# Patient Record
Sex: Male | Born: 1950
Health system: Southern US, Community
[De-identification: ages and names within clinical notes are randomized; demographics above are authoritative.]

## PROBLEM LIST (undated history)

## (undated) DIAGNOSIS — K219 Gastro-esophageal reflux disease without esophagitis: Secondary | ICD-10-CM

## (undated) DIAGNOSIS — Z973 Presence of spectacles and contact lenses: Secondary | ICD-10-CM

## (undated) DIAGNOSIS — I77810 Thoracic aortic ectasia: Secondary | ICD-10-CM

## (undated) DIAGNOSIS — K589 Irritable bowel syndrome without diarrhea: Secondary | ICD-10-CM

## (undated) DIAGNOSIS — K6389 Other specified diseases of intestine: Secondary | ICD-10-CM

## (undated) DIAGNOSIS — M1990 Unspecified osteoarthritis, unspecified site: Secondary | ICD-10-CM

## (undated) HISTORY — PX: KNEE ARTHROSCOPY: SUR90

## (undated) HISTORY — PX: HERNIA REPAIR: SHX51

## (undated) HISTORY — DX: Thoracic aortic ectasia: I77.810

## (undated) HISTORY — DX: Irritable bowel syndrome, unspecified: K58.9

## (undated) HISTORY — DX: Presence of spectacles and contact lenses: Z97.3

## (undated) HISTORY — PX: MASS EXCISION: SHX2000

## (undated) HISTORY — DX: Unspecified osteoarthritis, unspecified site: M19.90

## (undated) HISTORY — DX: Other specified diseases of intestine: K63.89

## (undated) HISTORY — DX: Gastro-esophageal reflux disease without esophagitis: K21.9

---

## 1994-01-22 DIAGNOSIS — M170 Bilateral primary osteoarthritis of knee: Secondary | ICD-10-CM | POA: Insufficient documentation

## 1998-01-11 ENCOUNTER — Ambulatory Visit (HOSPITAL_BASED_OUTPATIENT_CLINIC_OR_DEPARTMENT_OTHER): Admission: RE | Admit: 1998-01-11 | Discharge: 1998-01-11 | Payer: Self-pay | Admitting: General Surgery

## 1998-01-22 DIAGNOSIS — M47819 Spondylosis without myelopathy or radiculopathy, site unspecified: Secondary | ICD-10-CM | POA: Insufficient documentation

## 1998-01-28 ENCOUNTER — Encounter: Payer: Self-pay | Admitting: Specialist

## 1998-01-28 ENCOUNTER — Ambulatory Visit (HOSPITAL_COMMUNITY): Admission: RE | Admit: 1998-01-28 | Discharge: 1998-01-28 | Payer: Self-pay | Admitting: Specialist

## 1998-02-07 ENCOUNTER — Encounter: Payer: Self-pay | Admitting: Specialist

## 1998-02-07 ENCOUNTER — Ambulatory Visit (HOSPITAL_COMMUNITY): Admission: RE | Admit: 1998-02-07 | Discharge: 1998-02-07 | Payer: Self-pay | Admitting: Specialist

## 2001-03-11 ENCOUNTER — Ambulatory Visit (HOSPITAL_COMMUNITY): Admission: RE | Admit: 2001-03-11 | Discharge: 2001-03-11 | Payer: Self-pay | Admitting: Gastroenterology

## 2001-03-21 ENCOUNTER — Ambulatory Visit (HOSPITAL_COMMUNITY): Admission: RE | Admit: 2001-03-21 | Discharge: 2001-03-21 | Payer: Self-pay | Admitting: Internal Medicine

## 2001-03-21 ENCOUNTER — Encounter: Payer: Self-pay | Admitting: Internal Medicine

## 2001-07-17 ENCOUNTER — Encounter: Admission: RE | Admit: 2001-07-17 | Discharge: 2001-07-17 | Payer: Self-pay | Admitting: Internal Medicine

## 2001-07-17 ENCOUNTER — Encounter: Payer: Self-pay | Admitting: Internal Medicine

## 2002-07-14 ENCOUNTER — Encounter: Payer: Self-pay | Admitting: Internal Medicine

## 2002-07-14 ENCOUNTER — Encounter: Admission: RE | Admit: 2002-07-14 | Discharge: 2002-07-14 | Payer: Self-pay | Admitting: Internal Medicine

## 2003-01-23 DIAGNOSIS — M19011 Primary osteoarthritis, right shoulder: Secondary | ICD-10-CM | POA: Insufficient documentation

## 2004-09-05 ENCOUNTER — Observation Stay (HOSPITAL_COMMUNITY): Admission: AD | Admit: 2004-09-05 | Discharge: 2004-09-08 | Payer: Self-pay | Admitting: Internal Medicine

## 2006-08-06 ENCOUNTER — Encounter: Admission: RE | Admit: 2006-08-06 | Discharge: 2006-08-06 | Payer: Self-pay | Admitting: Internal Medicine

## 2006-08-09 ENCOUNTER — Encounter: Admission: RE | Admit: 2006-08-09 | Discharge: 2006-08-09 | Payer: Self-pay | Admitting: Internal Medicine

## 2006-08-26 ENCOUNTER — Encounter (HOSPITAL_COMMUNITY): Admission: RE | Admit: 2006-08-26 | Discharge: 2006-10-16 | Payer: Self-pay | Admitting: Internal Medicine

## 2006-12-03 ENCOUNTER — Encounter: Admission: RE | Admit: 2006-12-03 | Discharge: 2006-12-03 | Payer: Self-pay | Admitting: Internal Medicine

## 2007-06-02 ENCOUNTER — Encounter: Admission: RE | Admit: 2007-06-02 | Discharge: 2007-06-02 | Payer: Self-pay | Admitting: Internal Medicine

## 2008-01-29 ENCOUNTER — Encounter: Admission: RE | Admit: 2008-01-29 | Discharge: 2008-01-29 | Payer: Self-pay | Admitting: Internal Medicine

## 2008-02-23 ENCOUNTER — Ambulatory Visit (HOSPITAL_COMMUNITY): Admission: RE | Admit: 2008-02-23 | Discharge: 2008-02-23 | Payer: Self-pay | Admitting: General Surgery

## 2008-02-23 HISTORY — PX: TUMOR REMOVAL: SHX12

## 2008-03-12 ENCOUNTER — Encounter (INDEPENDENT_AMBULATORY_CARE_PROVIDER_SITE_OTHER): Payer: Self-pay | Admitting: General Surgery

## 2008-03-12 ENCOUNTER — Inpatient Hospital Stay (HOSPITAL_COMMUNITY): Admission: AD | Admit: 2008-03-12 | Discharge: 2008-03-18 | Payer: Self-pay | Admitting: General Surgery

## 2009-07-06 ENCOUNTER — Encounter: Admission: RE | Admit: 2009-07-06 | Discharge: 2009-07-06 | Payer: Self-pay | Admitting: General Surgery

## 2010-02-12 ENCOUNTER — Encounter: Payer: Self-pay | Admitting: General Surgery

## 2010-05-09 LAB — BASIC METABOLIC PANEL
CO2: 31 mEq/L (ref 19–32)
Calcium: 8.7 mg/dL (ref 8.4–10.5)
Chloride: 101 mEq/L (ref 96–112)
GFR calc Af Amer: >60 mL/min (ref >60)
GFR calc non Af Amer: >60 mL/min (ref >60)
Glucose, Bld: 106 mg/dL — ABNORMAL HIGH (ref 70–99)
Potassium: 4.4 mEq/L (ref 3.5–5.1)
Potassium: 4.7 mEq/L (ref 3.5–5.1)
Sodium: 134 mEq/L — ABNORMAL LOW (ref 135–145)
Sodium: 138 mEq/L (ref 135–145)

## 2010-05-09 LAB — CBC
HCT: 32.9 % — ABNORMAL LOW (ref 39.0–52.0)
Hemoglobin: 11.3 g/dL — ABNORMAL LOW (ref 13.0–17.0)
Hemoglobin: 12.8 g/dL — ABNORMAL LOW (ref 13.0–17.0)
MCHC: 34.3 g/dL (ref 30.0–36.0)
MCV: 86.7 fL (ref 78.0–100.0)
Platelets: 183 10*3/uL (ref 150–400)
RBC: 4.33 MIL/uL (ref 4.22–5.81)
RDW: 13.6 % (ref 11.5–15.5)
RDW: 13.8 % (ref 11.5–15.5)
WBC: 4.8 10*3/uL (ref 4.0–10.5)

## 2010-05-09 LAB — DIFFERENTIAL
Basophils Relative: 0 % (ref 0–1)
Eosinophils Relative: 3 % (ref 0–5)
Monocytes Absolute: 0.4 10*3/uL (ref 0.1–1.0)
Monocytes Relative: 9 % (ref 3–12)
Neutro Abs: 2.9 10*3/uL (ref 1.7–7.7)

## 2010-05-09 LAB — COMPREHENSIVE METABOLIC PANEL
ALT: 28 U/L (ref 0–53)
AST: 21 U/L (ref 0–37)
Albumin: 3.9 g/dL (ref 3.5–5.2)
Alkaline Phosphatase: 89 U/L (ref 39–117)
BUN: 16 mg/dL (ref 6–23)
Chloride: 103 mEq/L (ref 96–112)
GFR calc Af Amer: >60 mL/min (ref >60)
Potassium: 4.3 mEq/L (ref 3.5–5.1)
Sodium: 142 mEq/L (ref 135–145)
Total Bilirubin: 0.7 mg/dL (ref 0.3–1.2)
Total Protein: 6.7 g/dL (ref 6.0–8.3)

## 2010-05-09 LAB — GLUCOSE, CAPILLARY: Glucose-Capillary: 91 mg/dL (ref 70–99)

## 2010-05-09 LAB — TYPE AND SCREEN: DAT, IgG: POSITIVE

## 2010-06-06 NOTE — Op Note (Signed)
Terry Roy, Terry Roy              ACCOUNT NO.:  [id number]   MEDICAL RECORD NO.:  [id number]          PATIENT TYPE:  INP   LOCATION:  0001                         FACILITY:  [facility]   PHYSICIAN:  Angelia Mould. Derrell Lolling, M.D.DATE OF BIRTH:  [birth year]   DATE OF PROCEDURE:  03/12/2008  DATE OF DISCHARGE:                               OPERATIVE REPORT   PREOPERATIVE DIAGNOSIS:  Small bowel mesenteric mass, suspect carcinoid  tumor.   POSTOPERATIVE DIAGNOSIS:  Small bowel mesenteric mass, suspect carcinoid  tumor.   OPERATION PERFORMED:  Segmental small bowel resection, resection of  mesenteric mass.   SURGEON:  Angelia Mould. Derrell Lolling, M.D.   FIRST ASSISTANT:  Sharlet Salina T. Hoxworth, M.D.   OPERATIVE INDICATIONS:  This is a 60 year old white man who has a 20-  year history of intermittent nausea and abdominal pain, although this  has not been disabling in any way.  This would occur every few months.  For the past 6 months, things have been getting worse with increasing  severity of pain and occasional vomiting, although he has not had any  weight loss.  In August 2008 he was hospitalized with abdominal pain and  had a CT scan and OctreoScan which showed a tissue density in the  mesentery.  The OctreoScan was negative, thus this was thought to be  benign.  He was followed with no change in his condition.  He had  worsening symptoms recently and a CT scan showed a calcified mass in the  small bowel mesentery measuring 3.2 cm x 2.5 cm, which was enlarged  compared to 1.6 x 1.3 cm in July 2008.  There were also some tiny  nodules.  Radiographically, they thought this was suspicious for  carcinoid tumor.  Colonoscopy was unremarkable.  Ultrasound of his  gallbladder was unremarkable.  I saw him as an outpatient.  He was  counseled to have an abdominal exploration and resection of this area.  Differential diagnosis was discussed.  He is in agreement and was  brought to the operating room  electively.   FINDINGS:  The patient had a hard multilobulated malignant mass  involving the proximal small bowel mesentery.  This was very bulky in  the mid jejunum.  Palpable tumor extended all the way back to and  probably surrounding the superior mesenteric artery, but the more bulky  part of the tumor was out in the jejunal branches.  I examined the small  bowel from the ligament of Treitz all the way to the ileocecal valve and  found no tumors or palpable abnormalities.  The appendix and cecum and  colon felt normal.  The rectum felt normal.  The retroperitoneum felt  normal.  The liver and gallbladder felt normal.  The stomach felt  normal.  There was no ascites.   OPERATIVE TECHNIQUE:  Following the induction of general endotracheal  anesthesia, a Foley catheter was inserted, and the patient's abdomen was  prepped and draped in a sterile fashion.  Time-out was held and the  patient was identified as correct patient and correct procedure.  Imaging was reviewed.  Antibiotics were given.  Midline laparotomy  incision was made.  The fascia was incised in the midline and the  abdominal cavity entered and explored, with findings as described above.  A self-retaining retractor was placed and we packed off the stomach and  transverse colon.  We examined everything very carefully and decided  about strategy for resection.  We decided to resect a segment of small  bowel probably 2 feet in length.  We transected the small bowel which  was mostly mid jejunum.  We transected proximally and distally using a  GIA stapling device.  The more peripheral mesentery was controlled with  a LigaSure device.  As we got down around the malignant-appearing  mesenteric mass, we took the dissection very cautiously and slowly.  The  mesentery was thicker in this area and we isolated small segments of the  mesentery between clamps, divided it, and controlled it with 2-0 silk  suture ligatures.  We got down  to the very proximal part of the  mesentery and very carefully dissected around this area, keeping in mind  the location of the superior mesenteric artery which was probably no  more than 1-2 cm away.  As we came across the mesentery, there was one  major jejunal branch which bled fairly briskly, but we were able to  clamp this quickly with 3 clamps, divide it, and remove the specimen.   The specimen was sent to the lab and Dr. Delila Spence looked at this and cut  it and said it had a typical yellow color of a carcinoid tumor.  He said  he did not see a primary tumor in the small bowel.   We controlled the jejunal mesenteric arterial supply with 2 suture  ligatures of 2-0 silk.  This provided nice secure control.  We irrigated  out the entire area.  The distal small bowel looked a little bit  ischemic for about 2 inches, and so we chose to resect about another 2  inches of small bowel, dividing the mesentery with the LigaSure, and  dividing the small bowel with the GIA.  This was sent for routine  histology.   At this point, we had excellent pulsations in the mesentery and pink,  healthy bowel.  Anastomosis between the proximal and distal limbs of the  small bowel were created with the GIA stapling device.  We checked the  staple line and there was no bleeding.  The defect in the bowel wall was  closed with a TA 60 stapling device.  A few sutures of 3-0 silk were  placed to reinforce the staple line at critical points.   At this point we changed our gloves and instruments.  We irrigated out  the abdomen and pelvis fairly extensively with saline.  We checked for  bleeding in all of the areas of dissection and it looked very  hemostatic.  The mesentery was closed with multiple interrupted figure-  of-eight sutures of 3-0 silk.  After checking one more time, we placed  the small bowel and omentum back to their anatomic positions.  The  midline fascia was closed with a running suture of #1,  double-stranded  PDS, and skin was closed with skin staples.  Clean bandages were placed  and the patient taken to the recovery room in stable condition.  Estimated blood loss was about 200 mL or less.  Complications none.  Sponge, needle and instrument counts were correct.      Angelia Mould. Derrell Lolling, M.D.  Electronically Signed  HMI/MEDQ  D:  03/12/2008  T:  03/12/2008  Job:  [id number]   cc:   Thora Lance, M.D.  Fax: [phone]   Tasia Catchings, M.D.  Fax: [phone]   Lauretta I. Odogwu, M.D.  Fax: [phone]

## 2010-06-09 NOTE — Discharge Summary (Signed)
Terry Roy, Terry Roy NO.:  [id number]   MEDICAL RECORD NO.:  [id number]          PATIENT TYPE:  INP   LOCATION:  6714                         FACILITY:  [facility]   PHYSICIAN:  Thora Lance, M.D.  DATE OF BIRTH:  29-May-1950   DATE OF ADMISSION:  09/05/2004  DATE OF DISCHARGE:  09/08/2004                                 DISCHARGE SUMMARY   REASON FOR ADMISSION:  Mr. Riches is a 60 year old white male with a history  of recurrent episodes of nausea and vomiting over the last 17 years. Over  the last 4 weeks prior to admission, he had a problem with nausea and  vomiting about 2 or 3 times a day, almost every day. Twelve days prior to  admission, he had been bringing up small amounts of blood with his vomit. He  denied hematochezia or melena. He tried Zofran, Phenergan, and Emetrol as an  outpatient without help. He takes Prevacid chronically.   PHYSICAL EXAMINATION:  VITAL SIGNS:  Normal.  Physical examination was unremarkable.   LABORATORY DATA:  On admission CBC:  White blood cell count 6.7. Hemoglobin  14.4. Platelet count 215,000. PT 13 and PTT 34. Chemistry:  Sodium 138,  potassium 4.1, chloride 103, bicarb 30, glucose 92, BUN 13, creatinine 1.3,  calcium 9.2, total protein 6.5, albumin 3.8. AST 20, ALT 27, alkaline  phosphatase 68, total bilirubin 1.1, urinalysis clear.   HOSPITAL COURSE:  The patient was admitted for his chronic nausea and  vomiting. He was started on an intravenous proton pump inhibitor,  intravenous Zofran, and Reglan intravenously. He was seen by Dr. Sherin Quarry of  the gastroenterology service. On September 07, 2004, an endoscopy was done,  which was normal. The patient's nausea and vomiting did improve over the 2  plus days that he was in the hospital. At discharge, we decided to start him  on Zelnorm for possible motility disorder. He was asked to stop all of his  herbal and dietary supplements. His PPI was continued.   DISCHARGE  DIAGNOSES:  1.  Nausea and vomiting.  2.  Hematemesis.   PROCEDURE:  Esophagogastroduodenoscopy.   DISCHARGE MEDICATIONS:  1.  Prevacid 30 mg a day.  2.  Zelnorm 60 mg b.i.d.  3.  An anti-depressant will be considered as an outpatient.   DISPOSITION:  The patient home.   DIET:  Regular.   FOLLOW UP:  In 1 to 2 weeks Dr. Valentina Lucks.           ______________________________  Thora Lance, M.D.     JJG/MEDQ  D:  09/08/2004  T:  09/08/2004  Job:  [id number]   cc:   Gretta Arab. Valentina Lucks, M.D.  [address] [place]  [place] [address]  Fax: [phone]

## 2010-06-09 NOTE — Discharge Summary (Signed)
NAMEODA, LANSDOWNE NO.:  [id number]   MEDICAL RECORD NO.:  [id number]          PATIENT TYPE:  INP   LOCATION:  1531                         FACILITY:  [facility]   PHYSICIAN:  Angelia Mould. Derrell Lolling, M.D.DATE OF BIRTH:  July 20, 1950   DATE OF ADMISSION:  03/12/2008  DATE OF DISCHARGE:  03/18/2008                               DISCHARGE SUMMARY   FINAL DIAGNOSES:  1. 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  2. Postoperative paralytic ileus, resolved.   OPERATIONS PERFORMED:  1. Exploratory laparotomy.  2. Small bowel resection.  3. Resection of mesenteric mass.   DATE OF SURGERY:  March 12, 2008.   HISTORY:  This is a 60 year old white man who has a 20-year history of  intermittent nausea and abdominal pain.  This occurs every few months,  and would last several days at a time.  He has had no change in bowel  movements, no flushing, sweats or  hypertensive issues.  The pain has  been in his central upper abdomen.  He has had some occasional vomiting  more recently.  This been progressive to where he is now having some  daily symptoms.  His weight has remained stable.  In August 2008 he had  some imaging studies including a CT scan and Octreoscan which showed a  tissue density in the mesentery, but the Octreoscan was negative.  This  was thought to be benign.  Because of progressive symptoms, he recently  had another CT scan which showed a calcified mass in the small bowel  mesentery measuring 3.2 cm x 2.5 cm which was larger than it was before.  There were multiple adjacent nodules.  There was vascular lymphatic  congestion.  The radiologist felt that this was highly suspicious for  carcinoid tumor.  I evaluated the patient as an outpatient.  I was  unsure as to what the cause of his pain and abdominal mass were, but  advised him to undergo exploration and resection of this area if  possible.  He was brought to the hospital  electively.   HOSPITAL COURSE:  On the day of admission, the patient was taken to the  operating room and underwent exploration.  I found that he had a hard  mass in the upper small bowel mesentery.  This could not be completely  resected as the thickening extended all the way back to the superior  mesenteric artery.  We did a subtotal debulking of this 3.5 to 4 cm  mass, and did a small bowel resection in the area with primary  anastomosis.   The final pathology report returned that this was a benign process.  A  partially calcified nodule with fibrosis and fat necrosis was noted.  All the lymph nodes were negative.  He was advised of his benign  diagnosis.   Postoperatively the patient did relatively well.  He had an ileus for  several days.  In fact had some nausea and vomiting on postoperative day  #2 which then slowly resolved.  He was tolerating a liquid diet by  the  afternoon of  February 23, and progressed to a regular diet and did well.  He was  ready to go home on February 25.  He was tolerating regular food, had  had a bowel movement, was feeling much better.  Abdominal wound looked  fine.  He was given a prescription for Tylox for pain.  He was given a  follow-up with me in the office in 1-2 weeks.      Angelia Mould. Derrell Lolling, M.D.  Electronically Signed     HMI/MEDQ  D:  04/13/2008  T:  04/13/2008  Job:  [id number]   cc:   Thora Lance, M.D.  Fax: [phone]   Theressa Millard, M.D.  Fax: [phone]   Tasia Catchings, M.D.  Fax: [phone]

## 2010-06-09 NOTE — Consult Note (Signed)
NAMEROHAN, JUENGER NO.:  [id number]   MEDICAL RECORD NO.:  [id number]          PATIENT TYPE:  INP   LOCATION:  6714                         FACILITY:  [facility]   PHYSICIAN:  Tasia Catchings, M.D.   DATE OF BIRTH:  [birth year]   DATE OF CONSULTATION:  09/06/2004  DATE OF DISCHARGE:                                   CONSULTATION   PHYSICIAN REQUESTING CONSULTATION:  Thora Lance, M.D.   HISTORY OF PRESENT ILLNESS:  Mr. Karasik is a 60 year old male who I have  known for a long time.  He has a history over 20 years of intermittent  episodes lasting usually 2-7 days of constant nausea and a.m. vomiting.  These were worked up extensively several years ago including a trip to  [facility], when nothing was really found.  Over the years he has managed  to use Emetrol, an over-the-counter antiemetic, along with many supplements  and a PPI (currently Prilosec 30 mg daily) to minimize these symptoms.   However, about 3 weeks ago he began having the same typical episode and it  has persisted.  He vomits bile and mucus; and generally only in the morning;  this may include a little food if it occurs right after he tries to eat  something; however, he is able to hold down crackers and liquids at noon,  and eat a regular meal at night.  He has, however, lost about 5 pounds over  the past 3 weeks.  In the last week, he has also noticed occasional bright  red blood of small amounts when he vomits.  He has had no melena or  hematochezia.  His bowel movements have been normal and he denies abdominal  pain, except that during the moment of retching.   His last upper endoscopy was in 2003 and was entirely normal.   PHYSICAL EXAMINATION:  GENERAL:  He is a well-developed white male who is  overweight.  ABDOMEN:  Abdominal exam reveals normal bowel sounds, no tenderness, no  hepatosplenomegaly or masses palpable and no bruits.   LABORATORY DATA:  Normal CBC and CMET.   IMPRESSION:  1.  Nausea and vomiting of uncertain etiology.  This could be a motility      disorder, even though we have not been able to prove it.  There may be a      psychiatric component of this as well.  2.  Hematemesis likely from mild irritation, doubt significant      gastrointestinal bleed.   RECOMMENDATIONS:  1.  Add Reglan IV to his Zofran and IV fluids.  2.  Hold off on endoscopy at the present time.      Tasia Catchings, M.D.  Electronically Signed    JW/MEDQ  D:  09/06/2004  T:  09/06/2004  Job:  [id number]

## 2010-10-02 ENCOUNTER — Telehealth (INDEPENDENT_AMBULATORY_CARE_PROVIDER_SITE_OTHER): Payer: Self-pay | Admitting: General Surgery

## 2010-10-02 DIAGNOSIS — K6389 Other specified diseases of intestine: Secondary | ICD-10-CM

## 2010-10-02 NOTE — Telephone Encounter (Signed)
Pt notified per last ov note by Dr Derrell Lolling, pt is to have a ct abd and pelvis for yrly f/u mesenteric mass prior to next office visit in 2012. Order is placed and to Gavin Pound to set up.

## 2010-10-06 ENCOUNTER — Other Ambulatory Visit: Payer: Self-pay

## 2010-10-10 ENCOUNTER — Ambulatory Visit
Admission: RE | Admit: 2010-10-10 | Discharge: 2010-10-10 | Disposition: A | Discharge status: Home / Self Care | Payer: Federal, State, Local not specified - PPO | Source: Ambulatory Visit | Attending: General Surgery | Admitting: General Surgery

## 2010-10-10 DIAGNOSIS — K6389 Other specified diseases of intestine: Secondary | ICD-10-CM

## 2010-10-10 MED ORDER — IOHEXOL 300 MG/ML  SOLN: 125.0000 mL | Freq: Once | INTRAMUSCULAR | Status: AC | PRN

## 2010-10-27 ENCOUNTER — Encounter (INDEPENDENT_AMBULATORY_CARE_PROVIDER_SITE_OTHER): Payer: Self-pay | Admitting: General Surgery

## 2010-10-30 ENCOUNTER — Encounter (INDEPENDENT_AMBULATORY_CARE_PROVIDER_SITE_OTHER): Payer: Self-pay | Admitting: General Surgery

## 2010-10-30 ENCOUNTER — Ambulatory Visit (INDEPENDENT_AMBULATORY_CARE_PROVIDER_SITE_OTHER): Payer: Federal, State, Local not specified - PPO | Admitting: General Surgery

## 2010-10-30 VITALS — BP 138/72 | HR 64 | Temp 97.4°F | Resp 16 | Ht 68.0 in | Wt 225.0 lb

## 2010-10-30 DIAGNOSIS — R1900 Intra-abdominal and pelvic swelling, mass and lump, unspecified site: Secondary | ICD-10-CM

## 2010-10-30 NOTE — Patient Instructions (Signed)
Your CT scan shows stability of the abdominal mass in the mesentery. There's been no change in size or in contour. There are  still some calcifications within it. Given the previous surgery this is almost certainly a benign fibrosis and nothing further needs to be done. Return to see me if further problems arise.

## 2010-10-30 NOTE — Progress Notes (Signed)
Subjective:     Patient ID: Terry Roy, male   DOB: Oct 08, 1950, 60 y.o.   MRN: [id number]  HPI This 60 year old Caucasian man returns to me for interval followup because of a mesenteric mass and tiny,  asymptomatic ventral hernias.  He is feeling well. He says that his tiny ventral hernias do not bother him. He has a chronic GI problems he continued to be followed Dr. Charlean Sanfilippo  At the Dept of gastroenterology at [facility]. He is also been seen at [facility] by Dr. Eliezer Mccoy "functional vomiting".  His weight has been stable at 225. He has occasional minimal pain in his abdomen. His appetite is excellent. His bowel function is basically normal.  Past history is significant for a laparotomy and excision of a mesenteric mass which was shown to be benign fat necrosis and fibrosis back in Feb.,  2010.  He had an interval CT scan on October 02, 2010 which shows no change. The residual calcified mesenteric mass is stable. His suggested this is a benign fibrosis or indolent tumor by the radiologist.  Past Medical History  Diagnosis Date  . IBS (irritable bowel syndrome)   . Wears glasses   . Arthritis   . Mesenteric mass     benign fat necrosis and fibrosis  . GERD (gastroesophageal reflux disease)    Current Outpatient Prescriptions  Medication Sig Dispense Refill  . fish oil-omega-3 fatty acids 1000 MG capsule Take 2 g by mouth daily.        . Glucosamine-Chondroitin (GLUCOSAMINE CHONDR COMPLEX PO) Take 2,400-3,000 mg by mouth 2 (two) times daily.        . pantoprazole (PROTONIX) 40 MG tablet daily.      . sucralfate (CARAFATE) 1 G tablet daily.       No Known Allergies   Review of Systems 12 system review of systems is performed and is negative except as described above.    Objective:   Physical Exam  Constitutional: He appears well-developed and well-nourished. No distress.  HENT:  Head: Normocephalic and atraumatic.  Eyes: Conjunctivae are normal. Pupils are equal, round,  and reactive to light. Right eye exhibits no discharge. Left eye exhibits no discharge.  Abdominal: Soft. Bowel sounds are normal. He exhibits no distension and no mass. There is no tenderness. There is no rebound and no guarding.    Musculoskeletal: Normal range of motion. He exhibits no edema.  Skin: Skin is warm and dry. No rash noted. He is not diaphoretic. No erythema. No pallor.  Psychiatric: He has a normal mood and affect. His behavior is normal. Thought content normal.       Assessment:     Mesenteric mass, benign fat necrosis and fibrosis, status post explatory laparotomy and subtotal resection. Stable by CT.  A symptomatic ventral incisional hernias, small, reducible.    Plan:     Nothing further needs to be done about his mesenteric fat necrosis. I suggested that we stop doing CTs and as he develops new symptoms. He agrees with that.  He is not interested in ventral hernia repair at this time. I told him to return to see me if they become larger or painful.  Continue followup with Dr. Shawnie Pons at [facility] regarding his chronic GI problems.  Return to see me p.r.n.

## 2011-02-23 ENCOUNTER — Other Ambulatory Visit: Payer: Self-pay | Admitting: Internal Medicine

## 2011-02-23 ENCOUNTER — Ambulatory Visit
Admission: RE | Admit: 2011-02-23 | Discharge: 2011-02-23 | Disposition: A | Discharge status: Home / Self Care | Payer: Federal, State, Local not specified - PPO | Source: Ambulatory Visit | Attending: Internal Medicine | Admitting: Internal Medicine

## 2011-02-23 DIAGNOSIS — R1031 Right lower quadrant pain: Secondary | ICD-10-CM

## 2011-02-23 MED ORDER — IOHEXOL 300 MG/ML  SOLN
125.0000 mL | Freq: Once | INTRAMUSCULAR | Status: AC | PRN
  Administered 2011-02-23: 125 mL via INTRAVENOUS

## 2011-02-23 MED ORDER — IOHEXOL 300 MG/ML  SOLN
20.0000 mL | Freq: Once | INTRAMUSCULAR | Status: AC | PRN
  Administered 2011-02-23: 20 mL via ORAL

## 2013-06-12 ENCOUNTER — Other Ambulatory Visit: Payer: Self-pay | Admitting: Internal Medicine

## 2013-06-12 DIAGNOSIS — R109 Unspecified abdominal pain: Secondary | ICD-10-CM

## 2013-06-16 DIAGNOSIS — R109 Unspecified abdominal pain: Secondary | ICD-10-CM | POA: Insufficient documentation

## 2013-06-19 ENCOUNTER — Encounter (INDEPENDENT_AMBULATORY_CARE_PROVIDER_SITE_OTHER): Payer: Self-pay

## 2013-06-19 ENCOUNTER — Ambulatory Visit
Admission: RE | Admit: 2013-06-19 | Discharge: 2013-06-19 | Disposition: A | Discharge status: Home / Self Care | Payer: Federal, State, Local not specified - PPO | Source: Ambulatory Visit | Attending: Internal Medicine | Admitting: Internal Medicine

## 2013-06-19 DIAGNOSIS — R109 Unspecified abdominal pain: Secondary | ICD-10-CM

## 2013-06-19 MED ORDER — IOHEXOL 300 MG/ML  SOLN
125.0000 mL | Freq: Once | INTRAMUSCULAR | Status: AC | PRN
  Administered 2013-06-19: 125 mL via INTRAVENOUS

## 2013-07-10 ENCOUNTER — Other Ambulatory Visit (INDEPENDENT_AMBULATORY_CARE_PROVIDER_SITE_OTHER): Payer: Self-pay | Admitting: General Surgery

## 2013-07-10 ENCOUNTER — Encounter (INDEPENDENT_AMBULATORY_CARE_PROVIDER_SITE_OTHER): Payer: Self-pay | Admitting: General Surgery

## 2013-07-10 ENCOUNTER — Ambulatory Visit (INDEPENDENT_AMBULATORY_CARE_PROVIDER_SITE_OTHER): Payer: Federal, State, Local not specified - PPO | Admitting: General Surgery

## 2013-07-10 VITALS — BP 126/82 | HR 68 | Ht 68.0 in | Wt 236.0 lb

## 2013-07-10 DIAGNOSIS — K668 Other specified disorders of peritoneum: Secondary | ICD-10-CM

## 2013-07-10 DIAGNOSIS — K6389 Other specified diseases of intestine: Secondary | ICD-10-CM

## 2013-07-10 NOTE — Progress Notes (Signed)
Patient ID: Terry Roy, male   DOB: 1950-04-08, 63 y.o.   MRN: [id number]  Chief Complaint  Patient presents with  . abdominal mass    HPI Terry Roy is a 63 y.o. male.  He is referred back to me by Dr. Lavone Orn for evaluation of chronic nausea and vomiting, abdominal pain, and calcified mesenteric mass.  In 2010 I initially  evaluated this patient for chronic abdominal pain nausea and vomiting he had been known to have a mesenteric tissue mass since 2008.   I chose to operate on him on 03/12/2008. He had a hard multilobulated will mass involving the proximal small bowel mesentery it is very bulky and extended back to the superior mesenteric artery. I found no other abnormalities. I did a small bowel resection and so a debulking of the tumor. The final pathology report showed calcified nodule, fibrosis, inflammation and fat necrosis, no malignancy, 11 benign reactive lymph nodes, normal normal small bowel. He recovered from that surgery. It was not felt that further intervention was necessary.  This patient has had chronic nausea and vomiting without weight loss or diarrhea. This has been going on for 15 or 20 years. He is currently followed by Dr. Melvyn Neth at [facility] gastroenterology. He's been told this is a functional problem. He's also been evaluated at [facility] for this. He has a six-month history of anterior abdominal pain both upper and lower. It does not radiate to the back. It was worse but is now getting better and only occurs every 4-5 days. He does not really know what  triggers  this. He has gained about 10 pounds in the last 2 or 3 years. Appetite is otherwise good. Denies flushing, palpitations and tachycardia, diarrhea. He says he has a hiatal hernia her proton pump inhibitors helpless a great deal.   CT scan of abdomen pelvis was performed recently. Once again they noted a calcified mass at the mesenteric root. They say that this was previously 2 cm in size and is now  3.2 x 4.3 cm in size. They suggested ruling out carcinoid tumor. In 2010 his octreotide scan and chemical testing was negative for this.  He is here today with his wife. He is in no distress. HPI  Past Medical History  Diagnosis Date  . IBS (irritable bowel syndrome)   . Wears glasses   . Arthritis   . Mesenteric mass     benign fat necrosis and fibrosis  . GERD (gastroesophageal reflux disease)     Past Surgical History  Procedure Laterality Date  . Tumor removal  February 2010    benign   . Mass excision      mesenteric mass resection   . Hernia repair      ventral hernia     Family History  Problem Relation Age of Onset  . Osteoporosis Mother   . Heart disease Father   . Cancer Father     Leukemia    Social History History  Substance Use Topics  . Smoking status: Never Smoker   . Smokeless tobacco: Never Used  . Alcohol Use: No    No Known Allergies  Current Outpatient Prescriptions  Medication Sig Dispense Refill  . atorvastatin (LIPITOR) 10 MG tablet Take 10 mg by mouth.      . escitalopram (LEXAPRO) 10 MG tablet Take 20 mg by mouth.      . fish oil-omega-3 fatty acids 1000 MG capsule Take 2 g by mouth daily.        Marland Kitchen  Glucosamine-Chondroitin (GLUCOSAMINE CHONDR COMPLEX PO) Take 2,400-3,000 mg by mouth 2 (two) times daily.        Marland Kitchen omeprazole (PRILOSEC) 20 MG capsule       . pantoprazole (PROTONIX) 40 MG tablet daily.      . sucralfate (CARAFATE) 1 G tablet daily.       No current facility-administered medications for this visit.    Review of Systems Review of Systems  Constitutional: Negative for fever, chills and unexpected weight change.       Pleasant and cooperative. No distress. Weight 236 pounds. Height 5 feet 8 inches.  HENT: Negative for congestion, hearing loss, sore throat, trouble swallowing and voice change.   Eyes: Negative for visual disturbance.  Respiratory: Negative for cough and wheezing.   Cardiovascular: Negative for chest pain,  palpitations and leg swelling.  Gastrointestinal: Positive for nausea, vomiting and abdominal pain. Negative for diarrhea, constipation, blood in stool, abdominal distention, anal bleeding and rectal pain.  Genitourinary: Negative for hematuria and difficulty urinating.  Musculoskeletal: Negative for arthralgias, back pain and neck pain.  Skin: Negative for rash and wound.  Neurological: Negative for seizures, syncope, weakness and headaches.  Hematological: Negative for adenopathy. Does not bruise/bleed easily.  Psychiatric/Behavioral: Negative for confusion.    Blood pressure 126/82, pulse 68, height 5\' 8"  (1.727 m), weight 236 lb (107.049 kg).  Physical Exam Physical Exam  Constitutional: He is oriented to person, place, and time. He appears well-developed and well-nourished. No distress.  HENT:  Head: Normocephalic.  Nose: Nose normal.  Mouth/Throat: No oropharyngeal exudate.  Eyes: Conjunctivae and EOM are normal. Pupils are equal, round, and reactive to light. Right eye exhibits no discharge. Left eye exhibits no discharge. No scleral icterus.  Neck: Normal range of motion. Neck supple. No JVD present. No tracheal deviation present. No thyromegaly present.  Cardiovascular: Normal rate, regular rhythm, normal heart sounds and intact distal pulses.   No murmur heard. Pulmonary/Chest: Effort normal and breath sounds normal. No stridor. No respiratory distress. He has no wheezes. He has no rales. He exhibits no tenderness.  Abdominal: Soft. Bowel sounds are normal. He exhibits no distension and no mass. There is no tenderness. There is no rebound and no guarding.    Musculoskeletal: Normal range of motion. He exhibits no edema and no tenderness.  Lymphadenopathy:    He has no cervical adenopathy.  Neurological: He is alert and oriented to person, place, and time. He has normal reflexes. Coordination normal.  Skin: Skin is warm and dry. No rash noted. He is not diaphoretic. No  erythema. No pallor.  Psychiatric: He has a normal mood and affect. His behavior is normal. Judgment and thought content normal.    Data Reviewed Extensive old records including operative summaries, lab work and x-ray, pathology report. Dr. Delene Ruffini office notes. Recent CT scans.  Assessment    Abdominal pain, nausea and vomiting. This is a chronic process that has been going on for at least 15 years. I agree that this is more likely functional than pathologic, Especially considering active involvement by gastroenterology department at [facility] for some time.  Calcified mesenteric mass, slowly increasing in size. Probably benign considering extensive past workup and surgery. Carcinoid tumor possible but unlikely  Hiatal hernia  GERD  Hyperlipidemia     Plan    Long talk with patient and wife. Told him I did not think that he would need another operation but will withhold judgment for now. Discussed differential diagnosis with him, and told him  that the mesenteric mass was probably benign, and the transformation to carcinoid is unlikely  Scheduled for octreotide scan  Scheduled for blood chromogranin A and 24-hour urine for HIAA  Return to see me  2-4 weeks.  Possible referral to [facility] for second opinion if questions persist.        Edsel Petrin. Dalbert Batman, M.D., [facility] General and Minimally invasive Surgery Breast and Colorectal Surgery Office:   [phone] Pager:   [phone]  07/10/2013, 10:21 AM

## 2013-07-10 NOTE — Patient Instructions (Signed)
We have reviewed your long history of GI problems. We have reviewed the history of your surgery in 2010 for a benign calcified mesenteric mass.  We have discussed your chronic nausea and vomiting he we have discussed your abdominal pain, which you state is actually getting better.  Your CT scan shows that the calcified mesenteric mass has gotten a little bit bigger, almost doubled in size. The radiologist stated that because this is calcified we need to rule out carcinoid tumor. This is probably not a carcinoid tumor and is probably not a malignancy.  In 2010 all of your workup for carcinoid tumor was negative and the pathology showed benign fibrosis inflammation and fat necrosis. Small bowel was normal and the lymph nodes were normal. Most likely this is a benign process  To be sure, we will schedule you for an octreotide scan and some blood in urine test to rule out carcinoid tumor.  Return to see Dr. Dalbert Batman in 2-4 weeks to discuss this again.  I think that it is unlikely that you will need further surgery, but I'm going to withhold decision-making until all the tests are done.

## 2013-07-15 LAB — CHROMOGRANIN A: CHROMOGRANIN A: 4.4 ng/mL (ref 1.9–15.0)

## 2013-07-16 LAB — 5 HIAA, QUANTITATIVE, URINE, 24 HOUR: 5-HIAA, URINE: 6.4 mg/(24.h) — AB (ref <=6.0)

## 2013-07-21 ENCOUNTER — Encounter (HOSPITAL_COMMUNITY)
Admission: RE | Admit: 2013-07-21 | Discharge: 2013-07-21 | Disposition: A | Discharge status: Home / Self Care | Payer: Federal, State, Local not specified - PPO | Source: Ambulatory Visit | Attending: Diagnostic Radiology | Admitting: Diagnostic Radiology

## 2013-07-21 DIAGNOSIS — K668 Other specified disorders of peritoneum: Secondary | ICD-10-CM

## 2013-07-21 DIAGNOSIS — R82998 Other abnormal findings in urine: Secondary | ICD-10-CM | POA: Insufficient documentation

## 2013-07-21 DIAGNOSIS — R1909 Other intra-abdominal and pelvic swelling, mass and lump: Secondary | ICD-10-CM | POA: Insufficient documentation

## 2013-07-21 MED ORDER — INDIUM IN-111 PENTETREOTIDE IV KIT
6.0000 | PACK | Freq: Once | INTRAVENOUS | Status: AC | PRN
  Administered 2013-07-21: 6.3 via INTRAVENOUS

## 2013-07-22 ENCOUNTER — Encounter (HOSPITAL_COMMUNITY)
Admission: RE | Admit: 2013-07-22 | Discharge: 2013-07-22 | Disposition: A | Discharge status: Home / Self Care | Payer: Federal, State, Local not specified - PPO | Source: Ambulatory Visit | Attending: General Surgery | Admitting: General Surgery

## 2013-07-22 DIAGNOSIS — R1909 Other intra-abdominal and pelvic swelling, mass and lump: Secondary | ICD-10-CM | POA: Diagnosis present

## 2013-07-22 DIAGNOSIS — R82998 Other abnormal findings in urine: Secondary | ICD-10-CM | POA: Diagnosis not present

## 2013-07-23 ENCOUNTER — Encounter (HOSPITAL_COMMUNITY): Payer: Federal, State, Local not specified - PPO

## 2013-07-28 ENCOUNTER — Encounter (INDEPENDENT_AMBULATORY_CARE_PROVIDER_SITE_OTHER): Payer: Federal, State, Local not specified - PPO | Admitting: General Surgery

## 2013-08-19 ENCOUNTER — Ambulatory Visit (INDEPENDENT_AMBULATORY_CARE_PROVIDER_SITE_OTHER): Payer: Federal, State, Local not specified - PPO | Admitting: General Surgery

## 2013-08-19 ENCOUNTER — Encounter (INDEPENDENT_AMBULATORY_CARE_PROVIDER_SITE_OTHER): Payer: Federal, State, Local not specified - PPO | Admitting: General Surgery

## 2013-08-19 ENCOUNTER — Encounter (INDEPENDENT_AMBULATORY_CARE_PROVIDER_SITE_OTHER): Payer: Self-pay | Admitting: General Surgery

## 2013-08-19 VITALS — BP 138/83 | HR 82 | Temp 98.7°F | Resp 14 | Ht 68.5 in | Wt 233.6 lb

## 2013-08-19 DIAGNOSIS — K668 Other specified disorders of peritoneum: Secondary | ICD-10-CM

## 2013-08-19 NOTE — Progress Notes (Signed)
Patient ID: Terry Roy, male   DOB: [birth year], 63 y.o.   MRN: [id number] History: This patient returns for followup.      Chromogranin A is normal. 24-hour urine for 5-HIAA is 6.4, upper limits of normal. Octreotide scan is negative. All of this strongly suggested he does not have a carcinoid tumor. Most likely his calcified mesenteric mass his calcified fibrosis inflammation and fat necrosis, unchanged since 2008. Symptomatically he is unchanged. He has had  chronic nausea for 15-20 years.      His re- presentation on 07/10/2013 is summarized below:  He is referred back to me by Dr. Lavone Orn for evaluation of chronic nausea and vomiting, abdominal pain, and calcified mesenteric mass.  In 2010 I initially evaluated this patient for chronic abdominal pain nausea and vomiting he had been known to have a mesenteric tissue mass since 2008. I chose to operate on him on 03/12/2008. He had a hard multilobulated will mass involving the proximal small bowel mesentery it is very bulky and extended back to the superior mesenteric artery. I found no other abnormalities. I did a small bowel resection and so a debulking of the tumor. The final pathology report showed calcified nodule, fibrosis, inflammation and fat necrosis, no malignancy, 11 benign reactive lymph nodes, normal normal small bowel. He recovered from that surgery. It was not felt that further intervention was necessary.       This patient has had chronic nausea and vomiting without weight loss or diarrhea. This has been going on for 15 or 20 years. He is currently followed by Dr. Melvyn Neth at [facility] gastroenterology. He's been told this is a functional problem. He's also been evaluated at [facility] for this. He has a six-month history of anterior abdominal pain both upper and lower. It does not radiate to the back. It was worse but is now getting better and only occurs every 4-5 days. He does not really know what triggers this. He has gained  about 10 pounds in the last 2 or 3 years. Appetite is otherwise good. Denies flushing, palpitations and tachycardia, diarrhea. He says he has a hiatal hernia her proton pump inhibitors helpless a great deal.       CT scan of abdomen pelvis was performed recently. Once again they noted a calcified mass at the mesenteric root. They say that this was previously 2 cm in size and is now 3.2 x 4.3 cm in size. They suggested ruling out carcinoid tumor. In 2010 his octreotide scan and chemical testing was negative for this.  He is here today, once again,  with his wife. He is in no distress.  Past history, family history, social history, and review of systems are documented on the chart, unchanged, and noncontributory except as described above  Exam: Constitutional: He is oriented to person, place, and time. He appears well-developed and well-nourished. No distress.  HENT:  Head: Normocephalic. Abdominal: Soft. Bowel sounds are normal. He exhibits no distension and no mass. There is no tenderness. There is no rebound and no guarding.    Musculoskeletal: Normal range of motion. He exhibits no edema and no tenderness. .  Neurological: He is alert and oriented to person, place, and time. He has normal reflexes. Coordination normal.  Skin: Skin is warm and dry. No rash noted. He is not diaphoretic. No erythema. No pallor.  Psychiatric: He has a normal mood and affect. His behavior is normal. Judgment and thought content normal.   Assessment  Abdominal pain,  nausea and vomiting. This is a chronic process that has been going on for at least 15 years. I agree that this is more likely functional than pathologic, Especially considering active involvement by gastroenterology department at [facility] for some time.  Calcified mesenteric mass, slowly increasing in size. Probably benign considering extensive past workup, current workup,  and surgery. Carcinoid tumor unlikely  Hiatal hernia  GERD  Hyperlipidemia    Plan: I advised against any surgical expiration of his abdomen  I advised him to discuss second surgical opinion at [facility] with his [facility] gastroenterologist Return to see me as needed.   Edsel Petrin. Dalbert Batman, M.D., [facility] General and Minimally invasive Surgery Breast and Colorectal Surgery Office:   [phone] Pager:   [phone]

## 2013-08-19 NOTE — Patient Instructions (Signed)
Your blood tests, urine test, and nuclear medicine x-ray all negative for carcinoid tumor.  I do not think that you need another operation. I think that the calcified mass in the mesentery is benign.  I would advise that you discuss this with Dr. Melvyn Neth at [facility] gastroenterology. Consider a second opinion from one of the surgeons down there at his recommendation.  Return to see Dr. Dalbert Batman as necessary.

## 2014-01-08 ENCOUNTER — Emergency Department (HOSPITAL_BASED_OUTPATIENT_CLINIC_OR_DEPARTMENT_OTHER)
Admission: EM | Admit: 2014-01-08 | Discharge: 2014-01-08 | Disposition: A | Discharge status: Home / Self Care | Payer: Federal, State, Local not specified - PPO | Attending: Emergency Medicine | Admitting: Emergency Medicine

## 2014-01-08 ENCOUNTER — Encounter (HOSPITAL_BASED_OUTPATIENT_CLINIC_OR_DEPARTMENT_OTHER): Payer: Self-pay | Admitting: Emergency Medicine

## 2014-01-08 DIAGNOSIS — K589 Irritable bowel syndrome without diarrhea: Secondary | ICD-10-CM | POA: Diagnosis not present

## 2014-01-08 DIAGNOSIS — Z79899 Other long term (current) drug therapy: Secondary | ICD-10-CM | POA: Insufficient documentation

## 2014-01-08 DIAGNOSIS — W275XXA Contact with paper-cutter, initial encounter: Secondary | ICD-10-CM | POA: Insufficient documentation

## 2014-01-08 DIAGNOSIS — Y9389 Activity, other specified: Secondary | ICD-10-CM | POA: Insufficient documentation

## 2014-01-08 DIAGNOSIS — IMO0002 Reserved for concepts with insufficient information to code with codable children: Secondary | ICD-10-CM

## 2014-01-08 DIAGNOSIS — Y9289 Other specified places as the place of occurrence of the external cause: Secondary | ICD-10-CM | POA: Insufficient documentation

## 2014-01-08 DIAGNOSIS — S61211A Laceration without foreign body of left index finger without damage to nail, initial encounter: Secondary | ICD-10-CM | POA: Diagnosis present

## 2014-01-08 DIAGNOSIS — M1990 Unspecified osteoarthritis, unspecified site: Secondary | ICD-10-CM | POA: Insufficient documentation

## 2014-01-08 DIAGNOSIS — Y998 Other external cause status: Secondary | ICD-10-CM | POA: Insufficient documentation

## 2014-01-08 MED ORDER — LIDOCAINE HCL (PF) 1 % IJ SOLN
INTRAMUSCULAR | Status: AC
  Administered 2014-01-08: 5 mL
  Filled 2014-01-08: qty 5

## 2014-01-08 NOTE — ED Provider Notes (Signed)
CSN: [id number]     Arrival date & time 01/08/14  1051 History   First MD Initiated Contact with Patient 01/08/14 1108     Chief Complaint  Patient presents with  . Extremity Laceration     (Consider location/radiation/quality/duration/timing/severity/associated sxs/prior Treatment) HPI Comments: Patient presents with a laceration to his left index finger that occurred while cutting a strap with a box cutter. He was seen at urgent care then sent here for over concerns of numbness. His last tetanus was one year ago.  Patient is a 63 y.o. male presenting with skin laceration. The history is provided by the patient.  Laceration Location:  Finger Finger laceration location:  L index finger Depth:  Through dermis Quality: straight   Bleeding: arterial   Time since incident:  1 hour Laceration mechanism:  Knife Pain details:    Severity:  Mild   Timing:  Constant   Progression:  Unchanged Foreign body present:  No foreign bodies Relieved by:  Nothing   Past Medical History  Diagnosis Date  . IBS (irritable bowel syndrome)   . Wears glasses   . Arthritis   . Mesenteric mass     benign fat necrosis and fibrosis  . GERD (gastroesophageal reflux disease)    Past Surgical History  Procedure Laterality Date  . Tumor removal  February 2010    benign   . Mass excision      mesenteric mass resection   . Hernia repair      ventral hernia   . Knee arthroscopy Bilateral    Family History  Problem Relation Age of Onset  . Osteoporosis Mother   . Heart disease Father   . Cancer Father     Leukemia   History  Substance Use Topics  . Smoking status: Never Smoker   . Smokeless tobacco: Never Used  . Alcohol Use: Yes     Comment: occ    Review of Systems  All other systems reviewed and are negative.     Allergies  Review of patient's allergies indicates no known allergies.  Home Medications   Prior to Admission medications   Medication Sig Start Date End Date  Taking? Authorizing Provider  atorvastatin (LIPITOR) 10 MG tablet Take 10 mg by mouth. 11/26/11  Yes Historical Provider, MD  escitalopram (LEXAPRO) 10 MG tablet Take 20 mg by mouth. 05/29/13  Yes Historical Provider, MD  fish oil-omega-3 fatty acids 1000 MG capsule Take 2 g by mouth daily.     Yes Historical Provider, MD  Glucosamine-Chondroitin (GLUCOSAMINE CHONDR COMPLEX PO) Take 2,400-3,000 mg by mouth 2 (two) times daily.     Yes Historical Provider, MD  mirtazapine (REMERON) 30 MG tablet  08/12/13  Yes Historical Provider, MD   BP 159/80 mmHg  Pulse 62  Temp(Src) 98 F (36.7 C) (Oral)  Resp 18  Ht 5\' 8"  (1.727 m)  Wt 230 lb (104.327 kg)  BMI 34.98 kg/m2  SpO2 99% Physical Exam  Constitutional: He is oriented to person, place, and time. He appears well-developed and well-nourished. No distress.  HENT:  Head: Normocephalic and atraumatic.  Neck: Normal range of motion. Neck supple.  Musculoskeletal:  There is a 1 cm laceration to the lateral aspect of the left index finger in the area of the PIP joint area there is no tendon involvement and he has full range of motion without difficulty. Sensation is intact to the tip of the finger. Bleeding is controlled.  Neurological: He is alert and oriented to person,  place, and time.  Skin: Skin is warm and dry. He is not diaphoretic.  Nursing note and vitals reviewed.   ED Course  Procedures (including critical care time) Labs Review Labs Reviewed - No data to display  Imaging Review No results found.   EKG Interpretation None     LACERATION REPAIR Performed by: Veryl Speak Authorized by: Veryl Speak Consent: Verbal consent obtained. Risks and benefits: risks, benefits and alternatives were discussed Consent given by: patient Patient identity confirmed: provided demographic data Prepped and Draped in normal sterile fashion Wound explored  Laceration Location: Left index finger  Laceration Length: 1 cm  No Foreign Bodies  seen or palpated  Anesthesia: local infiltration  Local anesthetic: lidocaine 1 % without epinephrine  Anesthetic total: 1 ml  Irrigation method: syringe Amount of cleaning: standard  Skin closure: 4-0 Prolene   Number of sutures: 2   Technique: Simple interrupted   Patient tolerance: Patient tolerated the procedure well with no immediate complications.   MDM   Final diagnoses:  Laceration    We'll treat with local wound care and when necessary follow-up. Sutures are to be removed in one week.    Veryl Speak, MD 01/08/14 [phone]

## 2014-01-08 NOTE — Discharge Instructions (Signed)
Local wound care with bacitracin and dressing changes twice daily.  Sutures are to be removed in one week. Follow-up with your primary Dr. for this, and return to the ER if you develop increased redness, pus draining from the wound, increased pain, or other new and concerning symptoms.   Laceration Care, Adult A laceration is a cut or lesion that goes through all layers of the skin and into the tissue just beneath the skin. TREATMENT  Some lacerations may not require closure. Some lacerations may not be able to be closed due to an increased risk of infection. It is important to see your caregiver as soon as possible after an injury to minimize the risk of infection and maximize the opportunity for successful closure. If closure is appropriate, pain medicines may be given, if needed. The wound will be cleaned to help prevent infection. Your caregiver will use stitches (sutures), staples, wound glue (adhesive), or skin adhesive strips to repair the laceration. These tools bring the skin edges together to allow for faster healing and a better cosmetic outcome. However, all wounds will heal with a scar. Once the wound has healed, scarring can be minimized by covering the wound with sunscreen during the day for 1 full year. HOME CARE INSTRUCTIONS  For sutures or staples:  Keep the wound clean and dry.  If you were given a bandage (dressing), you should change it at least once a day. Also, change the dressing if it becomes wet or dirty, or as directed by your caregiver.  Wash the wound with soap and water 2 times a day. Rinse the wound off with water to remove all soap. Pat the wound dry with a clean towel.  After cleaning, apply a thin layer of the antibiotic ointment as recommended by your caregiver. This will help prevent infection and keep the dressing from sticking.  You may shower as usual after the first 24 hours. Do not soak the wound in water until the sutures are removed.  Only take  over-the-counter or prescription medicines for pain, discomfort, or fever as directed by your caregiver.  Get your sutures or staples removed as directed by your caregiver. For skin adhesive strips:  Keep the wound clean and dry.  Do not get the skin adhesive strips wet. You may bathe carefully, using caution to keep the wound dry.  If the wound gets wet, pat it dry with a clean towel.  Skin adhesive strips will fall off on their own. You may trim the strips as the wound heals. Do not remove skin adhesive strips that are still stuck to the wound. They will fall off in time. For wound adhesive:  You may briefly wet your wound in the shower or bath. Do not soak or scrub the wound. Do not swim. Avoid periods of heavy perspiration until the skin adhesive has fallen off on its own. After showering or bathing, gently pat the wound dry with a clean towel.  Do not apply liquid medicine, cream medicine, or ointment medicine to your wound while the skin adhesive is in place. This may loosen the film before your wound is healed.  If a dressing is placed over the wound, be careful not to apply tape directly over the skin adhesive. This may cause the adhesive to be pulled off before the wound is healed.  Avoid prolonged exposure to sunlight or tanning lamps while the skin adhesive is in place. Exposure to ultraviolet light in the first year will darken the scar.  scar. °· The skin adhesive will usually remain in place for 5 to 10 days, then naturally fall off the skin. Do not pick at the adhesive film. °You may need a tetanus shot if: °· You cannot remember when you had your last tetanus shot. °· You have never had a tetanus shot. °If you get a tetanus shot, your arm may swell, get red, and feel warm to the touch. This is common and not a problem. If you need a tetanus shot and you choose not to have one, there is a rare chance of getting tetanus. Sickness from tetanus can be serious. °SEEK MEDICAL CARE IF:  °· You  have redness, swelling, or increasing pain in the wound. °· You see a red line that goes away from the wound. °· You have yellowish-white fluid (pus) coming from the wound. °· You have a fever. °· You notice a bad smell coming from the wound or dressing. °· Your wound breaks open before or after sutures have been removed. °· You notice something coming out of the wound such as wood or glass. °· Your wound is on your hand or foot and you cannot move a finger or toe. °SEEK IMMEDIATE MEDICAL CARE IF:  °· Your pain is not controlled with prescribed medicine. °· You have severe swelling around the wound causing pain and numbness or a change in color in your arm, hand, leg, or foot. °· Your wound splits open and starts bleeding. °· You have worsening numbness, weakness, or loss of function of any joint around or beyond the wound. °· You develop painful lumps near the wound or on the skin anywhere on your body. °MAKE SURE YOU:  °· Understand these instructions. °· Will watch your condition. °· Will get help right away if you are not doing well or get worse. °Document Released: 01/08/2005 Document Revised: 04/02/2011 Document Reviewed: 07/04/2010 °[employer]® Patient Information ©2015 [employer]. This information is not intended to replace advice given to you by your health care provider. Make sure you discuss any questions you have with your health care provider. ° °

## 2014-01-08 NOTE — ED Notes (Signed)
Pt using box cutter to cut strap and cut left index finger. Bleeding controlled and wrapped in gauze. Pt went to [facility] told to come here for xray and to check for nerve damage. Pt able to move finger.

## 2014-01-25 DIAGNOSIS — K929 Disease of digestive system, unspecified: Secondary | ICD-10-CM | POA: Insufficient documentation

## 2014-06-17 ENCOUNTER — Other Ambulatory Visit: Payer: Self-pay | Admitting: Dermatology

## 2014-11-22 ENCOUNTER — Other Ambulatory Visit: Payer: Self-pay | Admitting: Internal Medicine

## 2014-11-22 ENCOUNTER — Ambulatory Visit
Admission: RE | Admit: 2014-11-22 | Discharge: 2014-11-22 | Disposition: A | Discharge status: Home / Self Care | Payer: Federal, State, Local not specified - PPO | Source: Ambulatory Visit | Attending: Internal Medicine | Admitting: Internal Medicine

## 2014-11-22 DIAGNOSIS — M25512 Pain in left shoulder: Secondary | ICD-10-CM

## 2016-09-28 ENCOUNTER — Other Ambulatory Visit: Payer: Self-pay | Admitting: Internal Medicine

## 2016-09-28 ENCOUNTER — Ambulatory Visit
Admission: RE | Admit: 2016-09-28 | Discharge: 2016-09-28 | Disposition: A | Discharge status: Home / Self Care | Payer: Federal, State, Local not specified - PPO | Source: Ambulatory Visit | Attending: Internal Medicine | Admitting: Internal Medicine

## 2016-09-28 DIAGNOSIS — R1084 Generalized abdominal pain: Secondary | ICD-10-CM

## 2016-09-28 MED ORDER — IOPAMIDOL (ISOVUE-300) INJECTION 61%
125.0000 mL | Freq: Once | INTRAVENOUS | Status: AC | PRN
  Administered 2016-09-28: 125 mL via INTRAVENOUS

## 2016-09-28 MED ORDER — IOHEXOL 300 MG/ML  SOLN
30.0000 mL | Freq: Once | INTRAMUSCULAR | Status: AC | PRN
  Administered 2016-09-28: 30 mL via ORAL

## 2017-02-14 ENCOUNTER — Other Ambulatory Visit: Payer: Self-pay | Admitting: Internal Medicine

## 2017-02-14 ENCOUNTER — Ambulatory Visit
Admission: RE | Admit: 2017-02-14 | Discharge: 2017-02-14 | Disposition: A | Discharge status: Home / Self Care | Payer: Federal, State, Local not specified - PPO | Source: Ambulatory Visit | Attending: Internal Medicine | Admitting: Internal Medicine

## 2017-02-14 DIAGNOSIS — R079 Chest pain, unspecified: Secondary | ICD-10-CM

## 2017-02-18 ENCOUNTER — Encounter: Payer: Self-pay | Admitting: Cardiology

## 2017-02-18 ENCOUNTER — Encounter (INDEPENDENT_AMBULATORY_CARE_PROVIDER_SITE_OTHER): Payer: Self-pay

## 2017-02-18 ENCOUNTER — Ambulatory Visit: Payer: Federal, State, Local not specified - PPO | Admitting: Cardiology

## 2017-02-18 VITALS — BP 128/78 | HR 68 | Ht 68.0 in | Wt 227.8 lb

## 2017-02-18 DIAGNOSIS — R079 Chest pain, unspecified: Secondary | ICD-10-CM

## 2017-02-18 DIAGNOSIS — E7800 Pure hypercholesterolemia, unspecified: Secondary | ICD-10-CM | POA: Diagnosis not present

## 2017-02-18 DIAGNOSIS — R0602 Shortness of breath: Secondary | ICD-10-CM

## 2017-02-18 NOTE — Patient Instructions (Signed)
Medication Instructions:  Your physician recommends that you continue on your current medications as directed. Please refer to the Current Medication list given to you today.  Labwork: None ordered  Testing/Procedures: Your physician has requested that you have an echocardiogram. Echocardiography is a painless test that uses sound waves to create images of your heart. It provides your doctor with information about the size and shape of your heart and how well your heart's chambers and valves are working. This procedure takes approximately one hour. There are no restrictions for this procedure.  Your physician has requested that you have en exercise stress myoview. For further information please visit [email or web address]. Please follow instruction sheet, as given.   Follow-Up: Your physician wants you to follow-up as needed with Dr. Radford Pax.   Any Other Special Instructions Will Be Listed Below (If Applicable).     If you need a refill on your cardiac medications before your next appointment, please call your pharmacy.

## 2017-02-18 NOTE — Progress Notes (Signed)
Cardiology Office Note    Date:  02/18/2017   ID:  Terry Roy, DOB [birth year], MRN [id number]  PCP:  Lavone Orn, MD  Cardiologist:  Fransico Him, MD   Chief Complaint  Patient presents with  . New Patient (Initial Visit)    chest pain and SOB    History of Present Illness:  Terry Roy is a 67 y.o. male who is being seen today for the evaluation of chest pain and SOB at the request of Lavone Orn, MD.  This is a 67yo male with a history of GERD who initially saw his PCP earlier this month with cold sx including a fever and headache and was diagnosed with sinusitis.  He was treated with antibx but one week later represented with SOB and body aches with extreme fatigue and was given albuterol.  Since then he developed intermittent CP and pressure in the retrosternal area that is worse with exertion and even mild activity as well as walking up steps. He describes the pain as an ache that was would come and go. It was both exertional and nonexertional.  He has not had any more CP over the weekend.   He also gets SOB and lightheaded with the episodes of CP.  He has been having several episodes a day.  He still has some intermittent DOE but it has improved as well over the weekend.  He has a family history of premature CAD in his father who was dx in his 12's and a sister has carotid artery dz.  He has never smoked.  Labs were done which showed an elevated CPK at 332 and his statin was stopped.  EKG showed NSR at 64bpm with no ST changes.  He had a cardiac cath in the late 90's for CP that was normal.    Past Medical History:  Diagnosis Date  . Arthritis   . GERD (gastroesophageal reflux disease)   . IBS (irritable bowel syndrome)   . 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 Wears glasses     Past Surgical History:  Procedure Laterality Date  . HERNIA REPAIR     ventral hernia   . KNEE ARTHROSCOPY Bilateral   . MASS EXCISION     mesenteric mass  resection   . TUMOR REMOVAL  February 2010   benign     Current Medications: Current Meds  Medication Sig  . celecoxib (CELEBREX) 200 MG capsule Take 200 mg by mouth daily as needed. Anti inflammatory  . escitalopram (LEXAPRO) 10 MG tablet Take 10 mg by mouth 2 (two) times daily.   . fish oil-omega-3 fatty acids 1000 MG capsule Take 2 g by mouth daily.    . Glucosamine-Chondroitin (GLUCOSAMINE CHONDR COMPLEX PO) Take 2,400-3,000 mg by mouth 2 (two) times daily.    . mirtazapine (REMERON) 30 MG tablet Take 30 mg by mouth at bedtime.   Marland Kitchen omeprazole (PRILOSEC) 20 MG capsule Take 20 mg by mouth 2 (two) times daily before a meal.    Allergies:   Patient has no known allergies.   Social History   Socioeconomic History  . Marital status: Married    Spouse name: None  . Number of children: None  . Years of education: None  . Highest education level: None  Social Needs  . Financial resource strain: None  . Food insecurity - worry: None  . Food insecurity - inability: None  . Transportation needs - medical: None  .  Transportation needs - non-medical: None  Occupational History  . None  Tobacco Use  . Smoking status: Never Smoker  . Smokeless tobacco: Never Used  Substance and Sexual Activity  . Alcohol use: Yes    Comment: occ  . Drug use: No  . Sexual activity: None  Other Topics Concern  . None  Social History Narrative  . None     Family History:  The patient's family history includes Cancer in his father; Heart disease in his father; Osteoporosis in his mother.   ROS:   Please see the history of present illness.    Review of Systems  Musculoskeletal: Positive for joint swelling.  Neurological: Positive for dizziness.   All other systems reviewed and are negative.  No flowsheet data found.     PHYSICAL EXAM:   VS:  BP 128/78   Pulse 68   Ht 5\' 8"  (1.727 m)   Wt 227 lb 12.8 oz (103.3 kg)   BMI 34.64 kg/m    GEN: Well nourished, well developed, in no acute  distress  HEENT: normal  Neck: no JVD, carotid bruits, or masses Cardiac: RRR; no murmurs, rubs, or gallops,no edema.  Intact distal pulses bilaterally.  Respiratory:  clear to auscultation bilaterally, normal work of breathing GI: soft, nontender, nondistended, + BS MS: no deformity or atrophy  Skin: warm and dry, no rash Neuro:  Alert and Oriented x 3, Strength and sensation are intact Psych: euthymic mood, full affect  Wt Readings from Last 3 Encounters:  02/18/17 227 lb 12.8 oz (103.3 kg)  01/08/14 230 lb (104.3 kg)  08/19/13 233 lb 9.6 oz (106 kg)      Studies/Labs Reviewed:   EKG:  EKG is not ordered today.    Recent Labs: No results found for requested labs within last 8760 hours.   Lipid Panel No results found for: CHOL, TRIG, HDL, CHOLHDL, VLDL, LDLCALC, LDLDIRECT  Additional studies/ records that were reviewed today include:  Office notes from Dr. Lavone Orn    ASSESSMENT:    1. Chest pain, unspecified type   2. SOB (shortness of breath)   3. Pure hypercholesterolemia      PLAN:  In order of problems listed above:  1. Chest pain - he has typical and atypical symptoms for CAD but most concerning is that it is worse with exertion and has limited his activity.  He has a strong family history of vascular dz (father with CAD in 63's and sister with carotid disease).  He also has hyperlipidemia.  I will get a stress myoview to rule out ischemia.    2.   SOB - this could be related to underlying coronary ischemia.  Also could be due to residual bronchospasm from URI.  I will get an echo to assess LVF to make sure he has not developed a DCM from recent viral URI.  3.   Hyperlipidemia - this is followed by PCP.  Currently off statin due to muscle cramps and elevated CPK.  Medication Adjustments/Labs and Tests Ordered: Current medicines are reviewed at length with the patient today.  Concerns regarding medicines are outlined above.  Medication changes, Labs and  Tests ordered today are listed in the Patient Instructions below.  There are no Patient Instructions on file for this visit.   Signed, Fransico Him, MD  02/18/2017 3:10 PM    [facility] [address], [place], [place]  [address] Phone: [phone]; Fax: [phone]

## 2017-02-25 ENCOUNTER — Telehealth (HOSPITAL_COMMUNITY): Payer: Self-pay | Admitting: *Deleted

## 2017-02-25 NOTE — Telephone Encounter (Signed)
Patient given detailed instructions per Myocardial Perfusion Study Information Sheet for the test on 02/27/17 at 0715. Patient notified to arrive 15 minutes early and that it is imperative to arrive on time for appointment to keep from having the test rescheduled.  If you need to cancel or reschedule your appointment, please call the office within 24 hours of your appointment. . Patient verbalized understanding.Terry Roy, [name]

## 2017-02-27 ENCOUNTER — Encounter: Payer: Self-pay | Admitting: Cardiology

## 2017-02-27 ENCOUNTER — Telehealth: Payer: Self-pay

## 2017-02-27 ENCOUNTER — Other Ambulatory Visit: Payer: Self-pay

## 2017-02-27 ENCOUNTER — Ambulatory Visit (HOSPITAL_BASED_OUTPATIENT_CLINIC_OR_DEPARTMENT_OTHER): Payer: Federal, State, Local not specified - PPO

## 2017-02-27 ENCOUNTER — Ambulatory Visit (HOSPITAL_COMMUNITY): Payer: Federal, State, Local not specified - PPO | Attending: Cardiology

## 2017-02-27 DIAGNOSIS — R0602 Shortness of breath: Secondary | ICD-10-CM

## 2017-02-27 DIAGNOSIS — R079 Chest pain, unspecified: Secondary | ICD-10-CM | POA: Diagnosis not present

## 2017-02-27 DIAGNOSIS — I77810 Thoracic aortic ectasia: Secondary | ICD-10-CM

## 2017-02-27 LAB — MYOCARDIAL PERFUSION IMAGING
CHL CUP NUCLEAR SDS: 7
CHL CUP NUCLEAR SRS: 2
CHL CUP RESTING HR STRESS: 57 {beats}/min
CSEPED: 10 min
CSEPPHR: 137 {beats}/min
Estimated workload: 10.7 METS
Exercise duration (sec): 47 s
LV dias vol: 122 mL (ref 62–150)
LV sys vol: 54 mL
MPHR: 156 {beats}/min
Percent HR: 87 %
RATE: 0.35
SSS: 9
TID: 0.97

## 2017-02-27 MED ORDER — TECHNETIUM TC 99M TETROFOSMIN IV KIT
31.5000 | PACK | Freq: Once | INTRAVENOUS | Status: AC | PRN
  Administered 2017-02-27: 31.5 via INTRAVENOUS
  Filled 2017-02-27: qty 32

## 2017-02-27 MED ORDER — TECHNETIUM TC 99M TETROFOSMIN IV KIT
10.9000 | PACK | Freq: Once | INTRAVENOUS | Status: AC | PRN
  Administered 2017-02-27: 10.9 via INTRAVENOUS
  Filled 2017-02-27: qty 11

## 2017-02-27 NOTE — Telephone Encounter (Signed)
Notes recorded by Teressa Senter, RN on 02/27/2017 at 3:00 PM EST Left a detailed message on VM at [phone] with echo results.(dpr on file) Echo ordered to be scheduled next year.   Notes recorded by Sueanne Margarita, MD on 02/27/2017 at 12:14 PM EST Echo is normal except for mildly dilated aortic root and mild LAE - repeat echo limited in 1 year to followup on dilated aorta

## 2017-02-27 NOTE — Telephone Encounter (Signed)
Notes recorded by Teressa Senter, RN on 02/27/2017 at 3:57 PM EST Patient made aware of results and Dr. Theodosia Blender recommendation for a chest CT calcium score. Patient is in agreement with plan, verbalized understanding and thanked me for the call   Notes recorded by Sueanne Margarita, MD on 02/27/2017 at 2:36 PM EST Please get a chest CT calcium score to assess future risk

## 2017-03-12 ENCOUNTER — Ambulatory Visit (INDEPENDENT_AMBULATORY_CARE_PROVIDER_SITE_OTHER)
Admission: RE | Admit: 2017-03-12 | Discharge: 2017-03-12 | Disposition: A | Discharge status: Home / Self Care | Payer: Self-pay | Source: Ambulatory Visit | Attending: Cardiology | Admitting: Cardiology

## 2017-03-12 DIAGNOSIS — R079 Chest pain, unspecified: Secondary | ICD-10-CM

## 2017-06-03 ENCOUNTER — Other Ambulatory Visit: Payer: Self-pay | Admitting: Internal Medicine

## 2017-06-03 DIAGNOSIS — R1032 Left lower quadrant pain: Secondary | ICD-10-CM

## 2017-06-06 ENCOUNTER — Ambulatory Visit
Admission: RE | Admit: 2017-06-06 | Discharge: 2017-06-06 | Disposition: A | Discharge status: Home / Self Care | Payer: Federal, State, Local not specified - PPO | Source: Ambulatory Visit | Attending: Internal Medicine | Admitting: Internal Medicine

## 2017-06-06 DIAGNOSIS — R1032 Left lower quadrant pain: Secondary | ICD-10-CM

## 2017-06-06 MED ORDER — IOHEXOL 300 MG/ML  SOLN
125.0000 mL | Freq: Once | INTRAMUSCULAR | Status: AC | PRN
  Administered 2017-06-06: 125 mL via INTRAVENOUS

## 2017-09-02 ENCOUNTER — Other Ambulatory Visit: Payer: Self-pay | Admitting: Internal Medicine

## 2017-09-02 ENCOUNTER — Ambulatory Visit (INDEPENDENT_AMBULATORY_CARE_PROVIDER_SITE_OTHER): Payer: Federal, State, Local not specified - PPO

## 2017-09-02 DIAGNOSIS — R42 Dizziness and giddiness: Secondary | ICD-10-CM

## 2017-09-02 DIAGNOSIS — R002 Palpitations: Secondary | ICD-10-CM

## 2017-09-02 DIAGNOSIS — R0602 Shortness of breath: Secondary | ICD-10-CM

## 2017-10-09 ENCOUNTER — Other Ambulatory Visit: Payer: Self-pay | Admitting: Internal Medicine

## 2017-10-09 DIAGNOSIS — M542 Cervicalgia: Secondary | ICD-10-CM

## 2017-10-09 DIAGNOSIS — R531 Weakness: Secondary | ICD-10-CM

## 2017-10-09 DIAGNOSIS — G4452 New daily persistent headache (NDPH): Secondary | ICD-10-CM

## 2017-10-22 ENCOUNTER — Ambulatory Visit
Admission: RE | Admit: 2017-10-22 | Discharge: 2017-10-22 | Disposition: A | Discharge status: Home / Self Care | Payer: Federal, State, Local not specified - PPO | Source: Ambulatory Visit | Attending: Internal Medicine | Admitting: Internal Medicine

## 2017-10-22 DIAGNOSIS — G4452 New daily persistent headache (NDPH): Secondary | ICD-10-CM

## 2017-10-22 DIAGNOSIS — M542 Cervicalgia: Secondary | ICD-10-CM

## 2017-10-22 DIAGNOSIS — R531 Weakness: Secondary | ICD-10-CM

## 2017-10-22 MED ORDER — GADOBENATE DIMEGLUMINE 529 MG/ML IV SOLN: 20.0000 mL | Freq: Once | INTRAVENOUS | Status: DC | PRN

## 2017-10-22 MED ORDER — GADOBENATE DIMEGLUMINE 529 MG/ML IV SOLN
20.0000 mL | Freq: Once | INTRAVENOUS | Status: AC | PRN
  Administered 2017-10-22: 20 mL via INTRAVENOUS

## 2017-11-25 DIAGNOSIS — B0089 Other herpesviral infection: Secondary | ICD-10-CM | POA: Diagnosis not present

## 2017-11-25 DIAGNOSIS — L249 Irritant contact dermatitis, unspecified cause: Secondary | ICD-10-CM | POA: Diagnosis not present

## 2017-11-26 DIAGNOSIS — B009 Herpesviral infection, unspecified: Secondary | ICD-10-CM | POA: Diagnosis not present

## 2017-11-27 DIAGNOSIS — G4733 Obstructive sleep apnea (adult) (pediatric): Secondary | ICD-10-CM | POA: Diagnosis not present

## 2017-11-27 DIAGNOSIS — Z6835 Body mass index (BMI) 35.0-35.9, adult: Secondary | ICD-10-CM | POA: Diagnosis not present

## 2017-11-27 DIAGNOSIS — Z0000 Encounter for general adult medical examination without abnormal findings: Secondary | ICD-10-CM | POA: Diagnosis not present

## 2017-11-27 DIAGNOSIS — Z125 Encounter for screening for malignant neoplasm of prostate: Secondary | ICD-10-CM | POA: Diagnosis not present

## 2017-11-27 DIAGNOSIS — Z5181 Encounter for therapeutic drug level monitoring: Secondary | ICD-10-CM | POA: Diagnosis not present

## 2017-11-27 DIAGNOSIS — M5030 Other cervical disc degeneration, unspecified cervical region: Secondary | ICD-10-CM | POA: Diagnosis not present

## 2017-11-27 DIAGNOSIS — R739 Hyperglycemia, unspecified: Secondary | ICD-10-CM | POA: Diagnosis not present

## 2017-11-27 DIAGNOSIS — E7800 Pure hypercholesterolemia, unspecified: Secondary | ICD-10-CM | POA: Diagnosis not present

## 2017-11-27 DIAGNOSIS — Z23 Encounter for immunization: Secondary | ICD-10-CM | POA: Diagnosis not present

## 2017-11-27 DIAGNOSIS — G43A0 Cyclical vomiting, not intractable: Secondary | ICD-10-CM | POA: Diagnosis not present

## 2017-11-27 DIAGNOSIS — E669 Obesity, unspecified: Secondary | ICD-10-CM | POA: Diagnosis not present

## 2017-11-27 DIAGNOSIS — M1712 Unilateral primary osteoarthritis, left knee: Secondary | ICD-10-CM | POA: Diagnosis not present

## 2017-11-27 DIAGNOSIS — K219 Gastro-esophageal reflux disease without esophagitis: Secondary | ICD-10-CM | POA: Diagnosis not present

## 2017-12-23 DIAGNOSIS — Z6835 Body mass index (BMI) 35.0-35.9, adult: Secondary | ICD-10-CM | POA: Diagnosis not present

## 2017-12-23 DIAGNOSIS — K929 Disease of digestive system, unspecified: Secondary | ICD-10-CM | POA: Diagnosis not present

## 2017-12-23 DIAGNOSIS — R110 Nausea: Secondary | ICD-10-CM | POA: Diagnosis not present

## 2018-01-30 DIAGNOSIS — H52203 Unspecified astigmatism, bilateral: Secondary | ICD-10-CM | POA: Diagnosis not present

## 2018-01-30 DIAGNOSIS — H5213 Myopia, bilateral: Secondary | ICD-10-CM | POA: Diagnosis not present

## 2018-01-30 DIAGNOSIS — H524 Presbyopia: Secondary | ICD-10-CM | POA: Diagnosis not present

## 2018-01-31 DIAGNOSIS — G4733 Obstructive sleep apnea (adult) (pediatric): Secondary | ICD-10-CM | POA: Diagnosis not present

## 2018-02-11 DIAGNOSIS — D229 Melanocytic nevi, unspecified: Secondary | ICD-10-CM | POA: Diagnosis not present

## 2018-02-11 DIAGNOSIS — L218 Other seborrheic dermatitis: Secondary | ICD-10-CM | POA: Diagnosis not present

## 2018-02-11 DIAGNOSIS — L821 Other seborrheic keratosis: Secondary | ICD-10-CM | POA: Diagnosis not present

## 2018-02-11 DIAGNOSIS — B0089 Other herpesviral infection: Secondary | ICD-10-CM | POA: Diagnosis not present

## 2018-02-11 DIAGNOSIS — D485 Neoplasm of uncertain behavior of skin: Secondary | ICD-10-CM | POA: Diagnosis not present

## 2018-02-11 DIAGNOSIS — D1801 Hemangioma of skin and subcutaneous tissue: Secondary | ICD-10-CM | POA: Diagnosis not present

## 2018-02-11 DIAGNOSIS — L814 Other melanin hyperpigmentation: Secondary | ICD-10-CM | POA: Diagnosis not present

## 2018-03-06 DIAGNOSIS — G4733 Obstructive sleep apnea (adult) (pediatric): Secondary | ICD-10-CM | POA: Diagnosis not present

## 2018-03-10 ENCOUNTER — Ambulatory Visit (HOSPITAL_COMMUNITY): Payer: PPO | Attending: Cardiology

## 2018-03-10 ENCOUNTER — Encounter (INDEPENDENT_AMBULATORY_CARE_PROVIDER_SITE_OTHER): Payer: Self-pay

## 2018-03-10 DIAGNOSIS — I77810 Thoracic aortic ectasia: Secondary | ICD-10-CM | POA: Diagnosis not present

## 2018-03-10 DIAGNOSIS — I071 Rheumatic tricuspid insufficiency: Secondary | ICD-10-CM | POA: Diagnosis not present

## 2018-04-09 DIAGNOSIS — G4733 Obstructive sleep apnea (adult) (pediatric): Secondary | ICD-10-CM | POA: Diagnosis not present

## 2018-05-10 DIAGNOSIS — G4733 Obstructive sleep apnea (adult) (pediatric): Secondary | ICD-10-CM | POA: Diagnosis not present

## 2018-06-09 DIAGNOSIS — G4733 Obstructive sleep apnea (adult) (pediatric): Secondary | ICD-10-CM | POA: Diagnosis not present

## 2018-07-10 DIAGNOSIS — G4733 Obstructive sleep apnea (adult) (pediatric): Secondary | ICD-10-CM | POA: Diagnosis not present

## 2018-07-23 DIAGNOSIS — R112 Nausea with vomiting, unspecified: Secondary | ICD-10-CM | POA: Diagnosis not present

## 2018-10-10 DIAGNOSIS — G4733 Obstructive sleep apnea (adult) (pediatric): Secondary | ICD-10-CM | POA: Diagnosis not present

## 2018-10-23 DIAGNOSIS — G4733 Obstructive sleep apnea (adult) (pediatric): Secondary | ICD-10-CM | POA: Diagnosis not present

## 2018-10-23 DIAGNOSIS — M179 Osteoarthritis of knee, unspecified: Secondary | ICD-10-CM | POA: Diagnosis not present

## 2018-10-23 DIAGNOSIS — G43A0 Cyclical vomiting, not intractable: Secondary | ICD-10-CM | POA: Diagnosis not present

## 2018-10-23 DIAGNOSIS — E7800 Pure hypercholesterolemia, unspecified: Secondary | ICD-10-CM | POA: Diagnosis not present

## 2018-10-23 DIAGNOSIS — Z0000 Encounter for general adult medical examination without abnormal findings: Secondary | ICD-10-CM | POA: Diagnosis not present

## 2018-10-23 DIAGNOSIS — Z23 Encounter for immunization: Secondary | ICD-10-CM | POA: Diagnosis not present

## 2018-10-23 DIAGNOSIS — Z1389 Encounter for screening for other disorder: Secondary | ICD-10-CM | POA: Diagnosis not present

## 2018-10-23 DIAGNOSIS — K219 Gastro-esophageal reflux disease without esophagitis: Secondary | ICD-10-CM | POA: Diagnosis not present

## 2018-10-29 DIAGNOSIS — M170 Bilateral primary osteoarthritis of knee: Secondary | ICD-10-CM | POA: Diagnosis not present

## 2018-10-29 DIAGNOSIS — M1712 Unilateral primary osteoarthritis, left knee: Secondary | ICD-10-CM | POA: Diagnosis not present

## 2018-10-29 DIAGNOSIS — M1711 Unilateral primary osteoarthritis, right knee: Secondary | ICD-10-CM | POA: Diagnosis not present

## 2018-11-04 DIAGNOSIS — L820 Inflamed seborrheic keratosis: Secondary | ICD-10-CM | POA: Diagnosis not present

## 2018-11-04 DIAGNOSIS — Z1283 Encounter for screening for malignant neoplasm of skin: Secondary | ICD-10-CM | POA: Diagnosis not present

## 2018-11-04 DIAGNOSIS — D485 Neoplasm of uncertain behavior of skin: Secondary | ICD-10-CM | POA: Diagnosis not present

## 2018-11-04 DIAGNOSIS — D225 Melanocytic nevi of trunk: Secondary | ICD-10-CM | POA: Diagnosis not present

## 2018-11-04 DIAGNOSIS — D224 Melanocytic nevi of scalp and neck: Secondary | ICD-10-CM | POA: Diagnosis not present

## 2018-11-05 DIAGNOSIS — M170 Bilateral primary osteoarthritis of knee: Secondary | ICD-10-CM | POA: Diagnosis not present

## 2018-11-05 DIAGNOSIS — M1711 Unilateral primary osteoarthritis, right knee: Secondary | ICD-10-CM | POA: Diagnosis not present

## 2018-11-05 DIAGNOSIS — M1712 Unilateral primary osteoarthritis, left knee: Secondary | ICD-10-CM | POA: Diagnosis not present

## 2018-11-09 DIAGNOSIS — G4733 Obstructive sleep apnea (adult) (pediatric): Secondary | ICD-10-CM | POA: Diagnosis not present

## 2018-11-10 DIAGNOSIS — M179 Osteoarthritis of knee, unspecified: Secondary | ICD-10-CM | POA: Diagnosis not present

## 2018-11-10 DIAGNOSIS — E7800 Pure hypercholesterolemia, unspecified: Secondary | ICD-10-CM | POA: Diagnosis not present

## 2018-11-10 DIAGNOSIS — G4733 Obstructive sleep apnea (adult) (pediatric): Secondary | ICD-10-CM | POA: Diagnosis not present

## 2018-11-12 DIAGNOSIS — M1712 Unilateral primary osteoarthritis, left knee: Secondary | ICD-10-CM | POA: Diagnosis not present

## 2018-11-12 DIAGNOSIS — M1711 Unilateral primary osteoarthritis, right knee: Secondary | ICD-10-CM | POA: Diagnosis not present

## 2018-11-12 DIAGNOSIS — M170 Bilateral primary osteoarthritis of knee: Secondary | ICD-10-CM | POA: Diagnosis not present

## 2018-12-10 DIAGNOSIS — G4733 Obstructive sleep apnea (adult) (pediatric): Secondary | ICD-10-CM | POA: Diagnosis not present

## 2019-01-06 DIAGNOSIS — E7800 Pure hypercholesterolemia, unspecified: Secondary | ICD-10-CM | POA: Diagnosis not present

## 2019-02-02 DIAGNOSIS — H11153 Pinguecula, bilateral: Secondary | ICD-10-CM | POA: Diagnosis not present

## 2019-02-02 DIAGNOSIS — H5213 Myopia, bilateral: Secondary | ICD-10-CM | POA: Diagnosis not present

## 2019-02-08 IMAGING — CR DG CHEST 2V
2 series · 2 of 2 positions shown · non-contrast
Comparison: Chest x-ray of May 15, 2010

CLINICAL DATA: Chest pain, cough, and shortness of breath for the
past week.

EXAM:
CHEST  2 VIEW

[w chest pa]
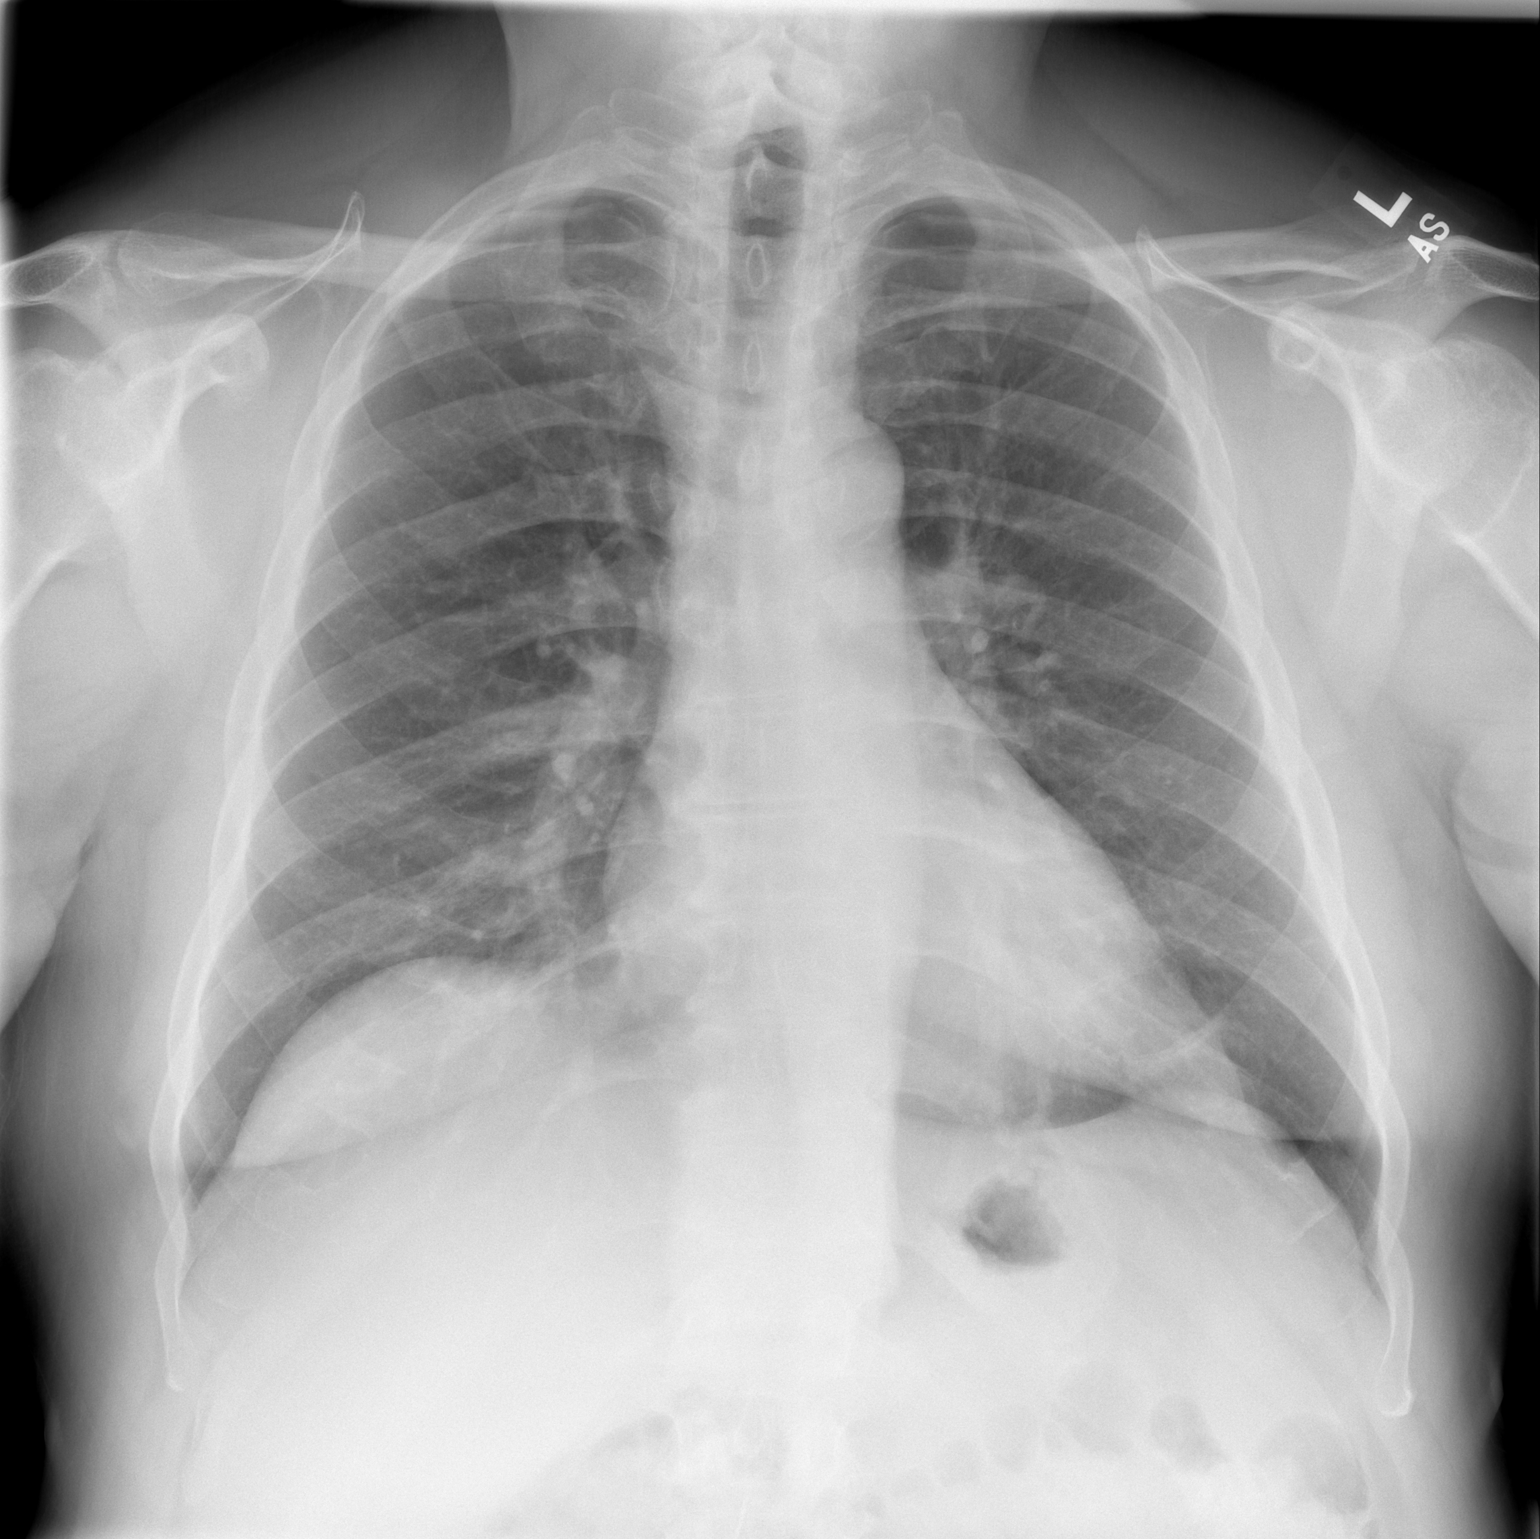

[w chest lat]
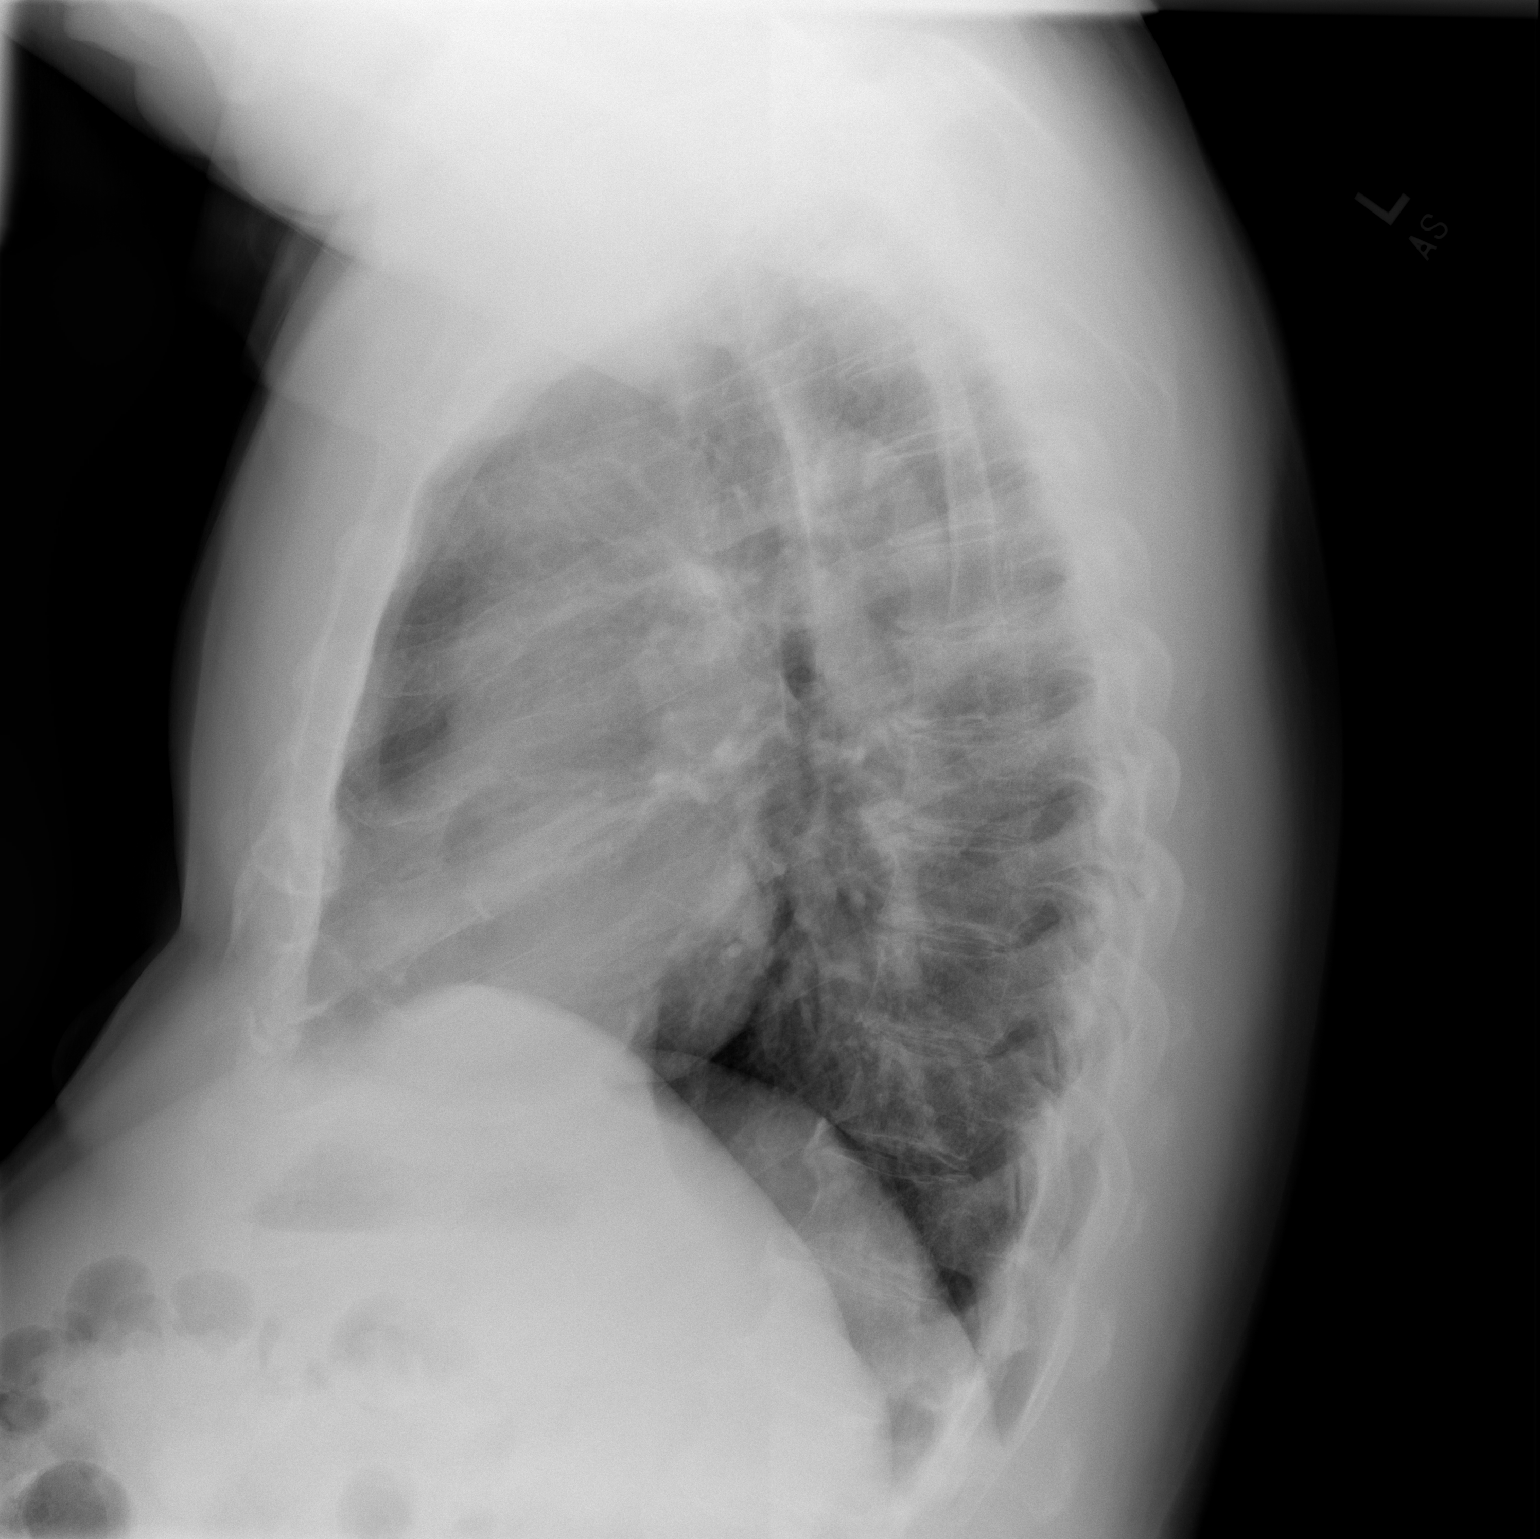

[2 of 2 positions shown; findings below may reference images not displayed]

FINDINGS: The lungs are well-expanded. There is no focal infiltrate. There is
no pleural effusion. The heart and pulmonary vascularity are normal.
The mediastinum is normal in width. There calcification in the wall
of the aortic arch. The bony thorax exhibits no acute abnormality.
IMPRESSION: There is no pneumonia, CHF, nor other acute cardiopulmonary
abnormality.

Thoracic aortic atherosclerosis.

## 2019-02-17 DIAGNOSIS — Z1283 Encounter for screening for malignant neoplasm of skin: Secondary | ICD-10-CM | POA: Diagnosis not present

## 2019-02-17 DIAGNOSIS — D485 Neoplasm of uncertain behavior of skin: Secondary | ICD-10-CM | POA: Diagnosis not present

## 2019-02-17 DIAGNOSIS — L821 Other seborrheic keratosis: Secondary | ICD-10-CM | POA: Diagnosis not present

## 2019-02-17 DIAGNOSIS — R1013 Epigastric pain: Secondary | ICD-10-CM | POA: Diagnosis not present

## 2019-02-17 DIAGNOSIS — B078 Other viral warts: Secondary | ICD-10-CM | POA: Diagnosis not present

## 2019-02-17 DIAGNOSIS — R202 Paresthesia of skin: Secondary | ICD-10-CM | POA: Diagnosis not present

## 2019-02-17 DIAGNOSIS — L570 Actinic keratosis: Secondary | ICD-10-CM | POA: Diagnosis not present

## 2019-02-17 DIAGNOSIS — X32XXXA Exposure to sunlight, initial encounter: Secondary | ICD-10-CM | POA: Diagnosis not present

## 2019-02-17 DIAGNOSIS — E7800 Pure hypercholesterolemia, unspecified: Secondary | ICD-10-CM | POA: Diagnosis not present

## 2019-02-17 DIAGNOSIS — R0789 Other chest pain: Secondary | ICD-10-CM | POA: Diagnosis not present

## 2019-02-18 DIAGNOSIS — R110 Nausea: Secondary | ICD-10-CM | POA: Diagnosis not present

## 2019-02-18 DIAGNOSIS — K929 Disease of digestive system, unspecified: Secondary | ICD-10-CM | POA: Diagnosis not present

## 2019-02-18 DIAGNOSIS — K219 Gastro-esophageal reflux disease without esophagitis: Secondary | ICD-10-CM | POA: Diagnosis not present

## 2019-02-20 DIAGNOSIS — E7800 Pure hypercholesterolemia, unspecified: Secondary | ICD-10-CM | POA: Diagnosis not present

## 2019-02-20 DIAGNOSIS — M179 Osteoarthritis of knee, unspecified: Secondary | ICD-10-CM | POA: Diagnosis not present

## 2019-02-23 ENCOUNTER — Other Ambulatory Visit: Payer: Self-pay | Admitting: Internal Medicine

## 2019-02-23 DIAGNOSIS — R1013 Epigastric pain: Secondary | ICD-10-CM

## 2019-02-23 DIAGNOSIS — I639 Cerebral infarction, unspecified: Secondary | ICD-10-CM

## 2019-02-23 HISTORY — DX: Cerebral infarction, unspecified: I63.9

## 2019-02-27 DIAGNOSIS — E7800 Pure hypercholesterolemia, unspecified: Secondary | ICD-10-CM | POA: Diagnosis not present

## 2019-02-27 DIAGNOSIS — M179 Osteoarthritis of knee, unspecified: Secondary | ICD-10-CM | POA: Diagnosis not present

## 2019-03-03 ENCOUNTER — Other Ambulatory Visit: Payer: Self-pay

## 2019-03-03 ENCOUNTER — Ambulatory Visit
Admission: RE | Admit: 2019-03-03 | Discharge: 2019-03-03 | Disposition: A | Discharge status: Home / Self Care | Payer: PPO | Source: Ambulatory Visit | Attending: Internal Medicine | Admitting: Internal Medicine

## 2019-03-03 DIAGNOSIS — R1013 Epigastric pain: Secondary | ICD-10-CM

## 2019-03-03 MED ORDER — IOPAMIDOL (ISOVUE-300) INJECTION 61%
125.0000 mL | Freq: Once | INTRAVENOUS | Status: AC | PRN
  Administered 2019-03-03: 125 mL via INTRAVENOUS

## 2019-03-23 ENCOUNTER — Other Ambulatory Visit: Payer: Self-pay | Admitting: Internal Medicine

## 2019-03-23 DIAGNOSIS — R200 Anesthesia of skin: Secondary | ICD-10-CM | POA: Diagnosis not present

## 2019-03-25 ENCOUNTER — Other Ambulatory Visit: Payer: Self-pay | Admitting: Internal Medicine

## 2019-03-25 DIAGNOSIS — R200 Anesthesia of skin: Secondary | ICD-10-CM

## 2019-03-26 ENCOUNTER — Other Ambulatory Visit: Payer: PPO

## 2019-03-27 ENCOUNTER — Ambulatory Visit
Admission: RE | Admit: 2019-03-27 | Discharge: 2019-03-27 | Disposition: A | Discharge status: Home / Self Care | Payer: PPO | Source: Ambulatory Visit | Attending: Internal Medicine | Admitting: Internal Medicine

## 2019-03-27 DIAGNOSIS — R200 Anesthesia of skin: Secondary | ICD-10-CM

## 2019-03-27 DIAGNOSIS — I6523 Occlusion and stenosis of bilateral carotid arteries: Secondary | ICD-10-CM | POA: Diagnosis not present

## 2019-04-16 DIAGNOSIS — E7800 Pure hypercholesterolemia, unspecified: Secondary | ICD-10-CM | POA: Diagnosis not present

## 2019-04-16 DIAGNOSIS — M179 Osteoarthritis of knee, unspecified: Secondary | ICD-10-CM | POA: Diagnosis not present

## 2019-04-21 ENCOUNTER — Ambulatory Visit
Admission: RE | Admit: 2019-04-21 | Discharge: 2019-04-21 | Disposition: A | Discharge status: Home / Self Care | Payer: PPO | Source: Ambulatory Visit | Attending: Internal Medicine | Admitting: Internal Medicine

## 2019-04-21 ENCOUNTER — Other Ambulatory Visit: Payer: Self-pay

## 2019-04-21 DIAGNOSIS — R200 Anesthesia of skin: Secondary | ICD-10-CM

## 2019-04-21 DIAGNOSIS — R531 Weakness: Secondary | ICD-10-CM | POA: Diagnosis not present

## 2019-04-28 DIAGNOSIS — E7800 Pure hypercholesterolemia, unspecified: Secondary | ICD-10-CM | POA: Diagnosis not present

## 2019-05-13 DIAGNOSIS — E7800 Pure hypercholesterolemia, unspecified: Secondary | ICD-10-CM | POA: Diagnosis not present

## 2019-05-13 DIAGNOSIS — M179 Osteoarthritis of knee, unspecified: Secondary | ICD-10-CM | POA: Diagnosis not present

## 2019-05-19 DIAGNOSIS — M9901 Segmental and somatic dysfunction of cervical region: Secondary | ICD-10-CM | POA: Diagnosis not present

## 2019-05-19 DIAGNOSIS — M50123 Cervical disc disorder at C6-C7 level with radiculopathy: Secondary | ICD-10-CM | POA: Diagnosis not present

## 2019-05-19 DIAGNOSIS — M9906 Segmental and somatic dysfunction of lower extremity: Secondary | ICD-10-CM | POA: Diagnosis not present

## 2019-05-19 DIAGNOSIS — M25561 Pain in right knee: Secondary | ICD-10-CM | POA: Diagnosis not present

## 2019-05-21 DIAGNOSIS — M50123 Cervical disc disorder at C6-C7 level with radiculopathy: Secondary | ICD-10-CM | POA: Diagnosis not present

## 2019-05-21 DIAGNOSIS — M9901 Segmental and somatic dysfunction of cervical region: Secondary | ICD-10-CM | POA: Diagnosis not present

## 2019-05-21 DIAGNOSIS — M9906 Segmental and somatic dysfunction of lower extremity: Secondary | ICD-10-CM | POA: Diagnosis not present

## 2019-05-21 DIAGNOSIS — M25561 Pain in right knee: Secondary | ICD-10-CM | POA: Diagnosis not present

## 2019-05-25 DIAGNOSIS — M9906 Segmental and somatic dysfunction of lower extremity: Secondary | ICD-10-CM | POA: Diagnosis not present

## 2019-05-25 DIAGNOSIS — M25561 Pain in right knee: Secondary | ICD-10-CM | POA: Diagnosis not present

## 2019-05-25 DIAGNOSIS — M9901 Segmental and somatic dysfunction of cervical region: Secondary | ICD-10-CM | POA: Diagnosis not present

## 2019-05-25 DIAGNOSIS — M50123 Cervical disc disorder at C6-C7 level with radiculopathy: Secondary | ICD-10-CM | POA: Diagnosis not present

## 2019-05-26 DIAGNOSIS — M9901 Segmental and somatic dysfunction of cervical region: Secondary | ICD-10-CM | POA: Diagnosis not present

## 2019-05-26 DIAGNOSIS — M9906 Segmental and somatic dysfunction of lower extremity: Secondary | ICD-10-CM | POA: Diagnosis not present

## 2019-05-26 DIAGNOSIS — M50123 Cervical disc disorder at C6-C7 level with radiculopathy: Secondary | ICD-10-CM | POA: Diagnosis not present

## 2019-05-26 DIAGNOSIS — M25561 Pain in right knee: Secondary | ICD-10-CM | POA: Diagnosis not present

## 2019-05-28 DIAGNOSIS — M9901 Segmental and somatic dysfunction of cervical region: Secondary | ICD-10-CM | POA: Diagnosis not present

## 2019-05-28 DIAGNOSIS — M25561 Pain in right knee: Secondary | ICD-10-CM | POA: Diagnosis not present

## 2019-05-28 DIAGNOSIS — M9906 Segmental and somatic dysfunction of lower extremity: Secondary | ICD-10-CM | POA: Diagnosis not present

## 2019-05-28 DIAGNOSIS — M50123 Cervical disc disorder at C6-C7 level with radiculopathy: Secondary | ICD-10-CM | POA: Diagnosis not present

## 2019-06-01 DIAGNOSIS — M9901 Segmental and somatic dysfunction of cervical region: Secondary | ICD-10-CM | POA: Diagnosis not present

## 2019-06-01 DIAGNOSIS — M50123 Cervical disc disorder at C6-C7 level with radiculopathy: Secondary | ICD-10-CM | POA: Diagnosis not present

## 2019-06-01 DIAGNOSIS — M9906 Segmental and somatic dysfunction of lower extremity: Secondary | ICD-10-CM | POA: Diagnosis not present

## 2019-06-01 DIAGNOSIS — M25561 Pain in right knee: Secondary | ICD-10-CM | POA: Diagnosis not present

## 2019-06-02 DIAGNOSIS — M25561 Pain in right knee: Secondary | ICD-10-CM | POA: Diagnosis not present

## 2019-06-02 DIAGNOSIS — M50123 Cervical disc disorder at C6-C7 level with radiculopathy: Secondary | ICD-10-CM | POA: Diagnosis not present

## 2019-06-02 DIAGNOSIS — M9906 Segmental and somatic dysfunction of lower extremity: Secondary | ICD-10-CM | POA: Diagnosis not present

## 2019-06-02 DIAGNOSIS — M9901 Segmental and somatic dysfunction of cervical region: Secondary | ICD-10-CM | POA: Diagnosis not present

## 2019-06-04 ENCOUNTER — Ambulatory Visit: Payer: PPO | Admitting: Neurology

## 2019-06-04 DIAGNOSIS — M25561 Pain in right knee: Secondary | ICD-10-CM | POA: Diagnosis not present

## 2019-06-04 DIAGNOSIS — M9906 Segmental and somatic dysfunction of lower extremity: Secondary | ICD-10-CM | POA: Diagnosis not present

## 2019-06-04 DIAGNOSIS — M9901 Segmental and somatic dysfunction of cervical region: Secondary | ICD-10-CM | POA: Diagnosis not present

## 2019-06-04 DIAGNOSIS — M50123 Cervical disc disorder at C6-C7 level with radiculopathy: Secondary | ICD-10-CM | POA: Diagnosis not present

## 2019-06-08 DIAGNOSIS — M9901 Segmental and somatic dysfunction of cervical region: Secondary | ICD-10-CM | POA: Diagnosis not present

## 2019-06-08 DIAGNOSIS — M25561 Pain in right knee: Secondary | ICD-10-CM | POA: Diagnosis not present

## 2019-06-08 DIAGNOSIS — M50123 Cervical disc disorder at C6-C7 level with radiculopathy: Secondary | ICD-10-CM | POA: Diagnosis not present

## 2019-06-08 DIAGNOSIS — M9906 Segmental and somatic dysfunction of lower extremity: Secondary | ICD-10-CM | POA: Diagnosis not present

## 2019-06-09 DIAGNOSIS — M25561 Pain in right knee: Secondary | ICD-10-CM | POA: Diagnosis not present

## 2019-06-09 DIAGNOSIS — M9906 Segmental and somatic dysfunction of lower extremity: Secondary | ICD-10-CM | POA: Diagnosis not present

## 2019-06-09 DIAGNOSIS — M9901 Segmental and somatic dysfunction of cervical region: Secondary | ICD-10-CM | POA: Diagnosis not present

## 2019-06-09 DIAGNOSIS — M50123 Cervical disc disorder at C6-C7 level with radiculopathy: Secondary | ICD-10-CM | POA: Diagnosis not present

## 2019-06-11 DIAGNOSIS — M50123 Cervical disc disorder at C6-C7 level with radiculopathy: Secondary | ICD-10-CM | POA: Diagnosis not present

## 2019-06-11 DIAGNOSIS — M25561 Pain in right knee: Secondary | ICD-10-CM | POA: Diagnosis not present

## 2019-06-11 DIAGNOSIS — M9906 Segmental and somatic dysfunction of lower extremity: Secondary | ICD-10-CM | POA: Diagnosis not present

## 2019-06-11 DIAGNOSIS — M9901 Segmental and somatic dysfunction of cervical region: Secondary | ICD-10-CM | POA: Diagnosis not present

## 2019-06-16 DIAGNOSIS — M25561 Pain in right knee: Secondary | ICD-10-CM | POA: Diagnosis not present

## 2019-06-16 DIAGNOSIS — M9906 Segmental and somatic dysfunction of lower extremity: Secondary | ICD-10-CM | POA: Diagnosis not present

## 2019-06-16 DIAGNOSIS — M50123 Cervical disc disorder at C6-C7 level with radiculopathy: Secondary | ICD-10-CM | POA: Diagnosis not present

## 2019-06-16 DIAGNOSIS — M9901 Segmental and somatic dysfunction of cervical region: Secondary | ICD-10-CM | POA: Diagnosis not present

## 2019-06-18 DIAGNOSIS — M9906 Segmental and somatic dysfunction of lower extremity: Secondary | ICD-10-CM | POA: Diagnosis not present

## 2019-06-18 DIAGNOSIS — M9901 Segmental and somatic dysfunction of cervical region: Secondary | ICD-10-CM | POA: Diagnosis not present

## 2019-06-18 DIAGNOSIS — M50123 Cervical disc disorder at C6-C7 level with radiculopathy: Secondary | ICD-10-CM | POA: Diagnosis not present

## 2019-06-18 DIAGNOSIS — M25561 Pain in right knee: Secondary | ICD-10-CM | POA: Diagnosis not present

## 2019-06-24 DIAGNOSIS — R110 Nausea: Secondary | ICD-10-CM | POA: Diagnosis not present

## 2019-06-24 DIAGNOSIS — K929 Disease of digestive system, unspecified: Secondary | ICD-10-CM | POA: Diagnosis not present

## 2019-06-25 DIAGNOSIS — M9906 Segmental and somatic dysfunction of lower extremity: Secondary | ICD-10-CM | POA: Diagnosis not present

## 2019-06-25 DIAGNOSIS — M9901 Segmental and somatic dysfunction of cervical region: Secondary | ICD-10-CM | POA: Diagnosis not present

## 2019-06-25 DIAGNOSIS — M25561 Pain in right knee: Secondary | ICD-10-CM | POA: Diagnosis not present

## 2019-06-25 DIAGNOSIS — M50123 Cervical disc disorder at C6-C7 level with radiculopathy: Secondary | ICD-10-CM | POA: Diagnosis not present

## 2019-07-02 DIAGNOSIS — M9901 Segmental and somatic dysfunction of cervical region: Secondary | ICD-10-CM | POA: Diagnosis not present

## 2019-07-02 DIAGNOSIS — M25561 Pain in right knee: Secondary | ICD-10-CM | POA: Diagnosis not present

## 2019-07-02 DIAGNOSIS — M9906 Segmental and somatic dysfunction of lower extremity: Secondary | ICD-10-CM | POA: Diagnosis not present

## 2019-07-02 DIAGNOSIS — M50123 Cervical disc disorder at C6-C7 level with radiculopathy: Secondary | ICD-10-CM | POA: Diagnosis not present

## 2019-07-09 ENCOUNTER — Ambulatory Visit: Payer: PPO | Admitting: Neurology

## 2019-07-15 DIAGNOSIS — T148XXA Other injury of unspecified body region, initial encounter: Secondary | ICD-10-CM | POA: Diagnosis not present

## 2019-07-16 DIAGNOSIS — M9906 Segmental and somatic dysfunction of lower extremity: Secondary | ICD-10-CM | POA: Diagnosis not present

## 2019-07-16 DIAGNOSIS — M50123 Cervical disc disorder at C6-C7 level with radiculopathy: Secondary | ICD-10-CM | POA: Diagnosis not present

## 2019-07-16 DIAGNOSIS — M9901 Segmental and somatic dysfunction of cervical region: Secondary | ICD-10-CM | POA: Diagnosis not present

## 2019-07-16 DIAGNOSIS — M25561 Pain in right knee: Secondary | ICD-10-CM | POA: Diagnosis not present

## 2019-08-17 DIAGNOSIS — E7800 Pure hypercholesterolemia, unspecified: Secondary | ICD-10-CM | POA: Diagnosis not present

## 2019-08-17 DIAGNOSIS — M179 Osteoarthritis of knee, unspecified: Secondary | ICD-10-CM | POA: Diagnosis not present

## 2019-09-01 ENCOUNTER — Ambulatory Visit: Payer: PPO | Admitting: Neurology

## 2019-09-16 DIAGNOSIS — M179 Osteoarthritis of knee, unspecified: Secondary | ICD-10-CM | POA: Insufficient documentation

## 2019-09-16 DIAGNOSIS — M1712 Unilateral primary osteoarthritis, left knee: Secondary | ICD-10-CM | POA: Diagnosis not present

## 2019-09-16 DIAGNOSIS — M170 Bilateral primary osteoarthritis of knee: Secondary | ICD-10-CM | POA: Diagnosis not present

## 2019-09-16 DIAGNOSIS — M1711 Unilateral primary osteoarthritis, right knee: Secondary | ICD-10-CM | POA: Diagnosis not present

## 2019-09-16 DIAGNOSIS — M1710 Unilateral primary osteoarthritis, unspecified knee: Secondary | ICD-10-CM | POA: Insufficient documentation

## 2019-09-23 DIAGNOSIS — M170 Bilateral primary osteoarthritis of knee: Secondary | ICD-10-CM | POA: Diagnosis not present

## 2019-09-30 DIAGNOSIS — M170 Bilateral primary osteoarthritis of knee: Secondary | ICD-10-CM | POA: Diagnosis not present

## 2019-10-05 DIAGNOSIS — M1711 Unilateral primary osteoarthritis, right knee: Secondary | ICD-10-CM | POA: Diagnosis not present

## 2019-10-09 ENCOUNTER — Ambulatory Visit: Payer: PPO | Admitting: Neurology

## 2019-10-16 DIAGNOSIS — D2261 Melanocytic nevi of right upper limb, including shoulder: Secondary | ICD-10-CM | POA: Diagnosis not present

## 2019-10-16 DIAGNOSIS — D485 Neoplasm of uncertain behavior of skin: Secondary | ICD-10-CM | POA: Diagnosis not present

## 2019-10-16 DIAGNOSIS — L820 Inflamed seborrheic keratosis: Secondary | ICD-10-CM | POA: Diagnosis not present

## 2019-10-16 DIAGNOSIS — L821 Other seborrheic keratosis: Secondary | ICD-10-CM | POA: Diagnosis not present

## 2019-10-16 DIAGNOSIS — D225 Melanocytic nevi of trunk: Secondary | ICD-10-CM | POA: Diagnosis not present

## 2019-10-22 DIAGNOSIS — K529 Noninfective gastroenteritis and colitis, unspecified: Secondary | ICD-10-CM | POA: Diagnosis not present

## 2019-10-30 DIAGNOSIS — L821 Other seborrheic keratosis: Secondary | ICD-10-CM | POA: Diagnosis not present

## 2019-10-30 DIAGNOSIS — L98499 Non-pressure chronic ulcer of skin of other sites with unspecified severity: Secondary | ICD-10-CM | POA: Diagnosis not present

## 2019-10-30 DIAGNOSIS — D485 Neoplasm of uncertain behavior of skin: Secondary | ICD-10-CM | POA: Diagnosis not present

## 2019-11-10 ENCOUNTER — Telehealth: Payer: Self-pay | Admitting: Neurology

## 2019-11-10 ENCOUNTER — Other Ambulatory Visit: Payer: Self-pay

## 2019-11-10 ENCOUNTER — Encounter: Payer: Self-pay | Admitting: Neurology

## 2019-11-10 ENCOUNTER — Ambulatory Visit: Payer: PPO | Admitting: Neurology

## 2019-11-10 VITALS — BP 183/73 | HR 66 | Ht 68.0 in | Wt 227.6 lb

## 2019-11-10 DIAGNOSIS — R42 Dizziness and giddiness: Secondary | ICD-10-CM | POA: Diagnosis not present

## 2019-11-10 DIAGNOSIS — G459 Transient cerebral ischemic attack, unspecified: Secondary | ICD-10-CM | POA: Diagnosis not present

## 2019-11-10 DIAGNOSIS — R200 Anesthesia of skin: Secondary | ICD-10-CM | POA: Diagnosis not present

## 2019-11-10 DIAGNOSIS — M5432 Sciatica, left side: Secondary | ICD-10-CM | POA: Diagnosis not present

## 2019-11-10 MED ORDER — GABAPENTIN 300 MG PO CAPS: 300.0000 mg | ORAL_CAPSULE | Freq: Every day | ORAL | 2 refills | Status: DC

## 2019-11-10 MED ORDER — ASPIRIN EC 81 MG PO TBEC: 81.0000 mg | DELAYED_RELEASE_TABLET | Freq: Every day | ORAL | 11 refills | Status: AC

## 2019-11-10 NOTE — Patient Instructions (Signed)
I had a long discussion with the patient regarding his episode of remote transient right-sided numbness weakness and lightheadedness likely representing left brain subcortical TIA.  He still having some intermittent transient lightheadedness episodes and hence I would recommend we check MRA of the brain and neck to evaluate for vertebrobasilar insufficiency as well as check lipid profile hemoglobin A1c.  I recommend he start aspirin 81 mg daily for stroke prevention and maintain aggressive risk factor modification with strict control of hypertension with blood pressure goal below 130/90, lipids with LDL cholesterol goal below 70 mg percent and diabetes with hemoglobin A1c goal below 6.5%.  He was also encouraged to eat a healthy diet with lots of fruits, vegetables, cereals and whole grains to exercise regularly.  I also advised him to lose weight.  He is also complaining of nocturnal paresthesias in both hands as well as some intermittent low back pain and left leg sciatic pain.  Recommend he wear wrist extension splints at both wrists during the day and night as well as try gabapentin 300 mg at night for his paresthesias.  He will return for follow-up in the future in 3 months or call earlier if necessary.  Stroke Prevention Some medical conditions and behaviors are associated with a higher chance of having a stroke. You can help prevent a stroke by making nutrition, lifestyle, and other changes, including managing any medical conditions you may have. What nutrition changes can be made?   Eat healthy foods. You can do this by: ? Choosing foods high in fiber, such as fresh fruits and vegetables and whole grains. ? Eating at least 5 or more servings of fruits and vegetables a day. Try to fill half of your plate at each meal with fruits and vegetables. ? Choosing lean protein foods, such as lean cuts of meat, poultry without skin, fish, tofu, beans, and nuts. ? Eating low-fat dairy products. ? Avoiding  foods that are high in salt (sodium). This can help lower blood pressure. ? Avoiding foods that have saturated fat, trans fat, and cholesterol. This can help prevent high cholesterol. ? Avoiding processed and premade foods.  Follow your health care provider's specific guidelines for losing weight, controlling high blood pressure (hypertension), lowering high cholesterol, and managing diabetes. These may include: ? Reducing your daily calorie intake. ? Limiting your daily sodium intake to 1,500 milligrams (mg). ? Using only healthy fats for cooking, such as olive oil, canola oil, or sunflower oil. ? Counting your daily carbohydrate intake. What lifestyle changes can be made?  Maintain a healthy weight. Talk to your health care provider about your ideal weight.  Get at least 30 minutes of moderate physical activity at least 5 days a week. Moderate activity includes brisk walking, biking, and swimming.  Do not use any products that contain nicotine or tobacco, such as cigarettes and e-cigarettes. If you need help quitting, ask your health care provider. It may also be helpful to avoid exposure to secondhand smoke.  Limit alcohol intake to no more than 1 drink a day for nonpregnant women and 2 drinks a day for men. One drink equals 12 oz of beer, 5 oz of wine, or 1 oz of hard liquor.  Stop any illegal drug use.  Avoid taking birth control pills. Talk to your health care provider about the risks of taking birth control pills if: ? You are over 28 years old. ? You smoke. ? You get migraines. ? You have ever had a blood clot. What other  changes can be made?  Manage your cholesterol levels. ? Eating a healthy diet is important for preventing high cholesterol. If cholesterol cannot be managed through diet alone, you may also need to take medicines. ? Take any prescribed medicines to control your cholesterol as told by your health care provider.  Manage your diabetes. ? Eating a healthy diet  and exercising regularly are important parts of managing your blood sugar. If your blood sugar cannot be managed through diet and exercise, you may need to take medicines. ? Take any prescribed medicines to control your diabetes as told by your health care provider.  Control your hypertension. ? To reduce your risk of stroke, try to keep your blood pressure below 130/80. ? Eating a healthy diet and exercising regularly are an important part of controlling your blood pressure. If your blood pressure cannot be managed through diet and exercise, you may need to take medicines. ? Take any prescribed medicines to control hypertension as told by your health care provider. ? Ask your health care provider if you should monitor your blood pressure at home. ? Have your blood pressure checked every year, even if your blood pressure is normal. Blood pressure increases with age and some medical conditions.  Get evaluated for sleep disorders (sleep apnea). Talk to your health care provider about getting a sleep evaluation if you snore a lot or have excessive sleepiness.  Take over-the-counter and prescription medicines only as told by your health care provider. Aspirin or blood thinners (antiplatelets or anticoagulants) may be recommended to reduce your risk of forming blood clots that can lead to stroke.  Make sure that any other medical conditions you have, such as atrial fibrillation or atherosclerosis, are managed. What are the warning signs of a stroke? The warning signs of a stroke can be easily remembered as BEFAST.  B is for balance. Signs include: ? Dizziness. ? Loss of balance or coordination. ? Sudden trouble walking.  E is for eyes. Signs include: ? A sudden change in vision. ? Trouble seeing.  F is for face. Signs include: ? Sudden weakness or numbness of the face. ? The face or eyelid drooping to one side.  A is for arms. Signs include: ? Sudden weakness or numbness of the arm,  usually on one side of the body.  S is for speech. Signs include: ? Trouble speaking (aphasia). ? Trouble understanding.  T is for time. ? These symptoms may represent a serious problem that is an emergency. Do not wait to see if the symptoms will go away. Get medical help right away. Call your local emergency services (911 in the [place] Do not drive yourself to the hospital.  Other signs of stroke may include: ? A sudden, severe headache with no known cause. ? Nausea or vomiting. ? Seizure. Where to find more information For more information, visit:  American Stroke Association: www.strokeassociation.org  National Stroke Association: www.stroke.org Summary  You can prevent a stroke by eating healthy, exercising, not smoking, limiting alcohol intake, and managing any medical conditions you may have.  Do not use any products that contain nicotine or tobacco, such as cigarettes and e-cigarettes. If you need help quitting, ask your health care provider. It may also be helpful to avoid exposure to secondhand smoke.  Remember BEFAST for warning signs of stroke. Get help right away if you or a loved one has any of these signs. This information is not intended to replace advice given to you by your health care  provider. Make sure you discuss any questions you have with your health care provider. Document Revised: 12/21/2016 Document Reviewed: 02/14/2016 Elsevier Patient Education  2020 [employer].

## 2019-11-10 NOTE — Progress Notes (Signed)
[facility] [address] [place]. [place] [address] [phone]       OFFICE CONSULT NOTE  [name] Date of Birth:  12-Oct-1950 Medical Record Number:  [id number]   Referring MD: Lavone Orn Reason for Referral: Lightheadedness  HPI: [name] is a 69 year old pleasant Caucasian male with past medical history of arthritis, gastroesophageal reflux disease irritable bowel syndrome who is seen today for initial office consultation visit.  History is obtained from the patient and review of referral notes and electronic medical records and available imaging films in PACS. He states that and February 2021 while he was working out at [employer] and noticed sudden onset of right arm and leg weakness and numbness and had trouble lifting his leg and putting weight on it this lasted about 5 minutes he also felt he was off balance.  His whole right side felt heavy but this resolved in 5 minutes.  He did not mention this to his doctor right away and but did mention it later on subsequently had an outpatient MRI scan of the brain done on 04/22/2019 showed no acute abnormality and showed mild changes of small vessel disease and was unchanged from a previous MRI from 2019.  He had outpatient carotid ultrasound done on 03/27/2019 which showed no significant extracranial stenosis.  Patient also complained symptoms of intermittent lightheadedness and a sensation of feeling to the left when he first gets up this happened once when he was getting out of a car with last 2 minutes he had to hold on but did not fall.  This may occur intermittently off and on but it is transient and he is never fallen or hurt himself.  He denies accompanying symptoms in the form of nausea vomiting vertigo headache or blurred vision.  He has had no previous definite history of stroke TIA.  On inquiry he also complains of intermittent pain in his hands which often wakes him up from sleep.  This feels better when he  shakes his hand.  He has never been evaluated for carpal tunnel.  He also has some intermittent low chronic back pain with occasional shooting pain going down the back and lateral aspect of his left leg.  Years ago he had some x-rays and was told he has a bulging disc. ROS:   14 system review of systems is positive for tingling, numbness, lightheadedness, imbalance all other systems negative  PMH:  Past Medical History:  Diagnosis Date  . Arthritis   . Dilated aortic root ([place])    60mm by echo 02/2017  . GERD (gastroesophageal reflux disease)   . IBS (irritable bowel syndrome)   . 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 Wears glasses     Social History:  Social History   Socioeconomic History  . Marital status: Married    Spouse name: Not on file  . Number of children: Not on file  . Years of education: Not on file  . Highest education level: Not on file  Occupational History  . Occupation: Retired  Tobacco Use  . Smoking status: Never Smoker  . Smokeless tobacco: Never Used  Substance and Sexual Activity  . Alcohol use: Yes    Comment: socially  . Drug use: No  . Sexual activity: Not on file  Other Topics Concern  . Not on file  Social History Narrative   Lives with wife   Right Handed   Drinks 1-2 cups caffeine daily  Social Determinants of Health   Financial Resource Strain:   . Difficulty of Paying Living Expenses: Not on file  Food Insecurity:   . Worried About Charity fundraiser in the Last Year: Not on file  . Ran Out of Food in the Last Year: Not on file  Transportation Needs:   . Lack of Transportation (Medical): Not on file  . Lack of Transportation (Non-Medical): Not on file  Physical Activity:   . Days of Exercise per Week: Not on file  . Minutes of Exercise per Session: Not on file  Stress:   . Feeling of Stress : Not on file  Social Connections:   . Frequency of Communication with Friends and Family: Not on file  . Frequency of  Social Gatherings with Friends and Family: Not on file  . Attends Religious Services: Not on file  . Active Member of Clubs or Organizations: Not on file  . Attends Archivist Meetings: Not on file  . Marital Status: Not on file  Intimate Partner Violence:   . Fear of Current or Ex-Partner: Not on file  . Emotionally Abused: Not on file  . Physically Abused: Not on file  . Sexually Abused: Not on file    Medications:   Current Outpatient Medications on File Prior to Visit  Medication Sig Dispense Refill  . celecoxib (CELEBREX) 200 MG capsule Take 200 mg by mouth daily as needed. Anti inflammatory    . escitalopram (LEXAPRO) 10 MG tablet Take 10 mg by mouth 2 (two) times daily.     . Evolocumab (REPATHA SURECLICK) 518 MG/ML SOAJ Inject into the skin.    . fish oil-omega-3 fatty acids 1000 MG capsule Take 2 g by mouth daily.      . Glucosamine-Chondroitin (GLUCOSAMINE CHONDR COMPLEX PO) Take 2,400-3,000 mg by mouth 2 (two) times daily.      . mirtazapine (REMERON) 30 MG tablet Take 30 mg by mouth at bedtime.     Marland Kitchen omeprazole (PRILOSEC) 20 MG capsule Take 20 mg by mouth 2 (two) times daily before a meal.     No current facility-administered medications on file prior to visit.    Allergies:  No Known Allergies  Physical Exam General: Mildly obese middle-aged Caucasian male seated, in no evident distress Head: head normocephalic and atraumatic.   Neck: supple with no carotid or supraclavicular bruits Cardiovascular: regular rate and rhythm, no murmurs Musculoskeletal: no deformity.  Straight leg raising test is negative. Skin:  no rash/petichiae Vascular:  Normal pulses all extremities  Neurologic Exam Mental Status: Awake and fully alert. Oriented to place and time. Recent and remote memory intact. Attention span, concentration and fund of knowledge appropriate. Mood and affect appropriate.  Cranial Nerves: Fundoscopic exam reveals sharp disc margins. Pupils equal,  briskly reactive to light. Extraocular movements full without nystagmus. Visual fields full to confrontation. Hearing intact. Facial sensation intact. Face, tongue, palate moves normally and symmetrically.  Motor: Normal bulk and tone. Normal strength in all tested extremity muscles. Sensory.: intact to touch , pinprick , position and vibratory sensation.  Tinel's test is negative over both wrists. Coordination: Rapid alternating movements normal in all extremities. Finger-to-nose and heel-to-shin performed accurately bilaterally. Gait and Station: Arises from chair without difficulty. Stance is normal. Gait demonstrates normal stride length and balance . Able to heel, toe and tandem walk without difficulty.  Reflexes: 1+ and symmetric. Toes downgoing.   NIHSS  0 Modified Rankin  0  ASSESSMENT: 69 year old Caucasian male with  transient episode of right-sided numbness and weakness in February 2021 likely left hemispheric TIA from small vessel disease.  Is also had some recurrent transient episodes of lightheadedness and vertebrobasilar ischemia is a consideration.  Chronic longstanding nocturnal hand paresthesias possibly carpal tunnel and intermittent left leg radicular pain from sciatica     PLAN: I had a long discussion with the patient regarding his episode of remote transient right-sided numbness weakness and lightheadedness likely representing left brain subcortical TIA.  He still having some intermittent transient lightheadedness episodes and hence I would recommend we check MRA of the brain and neck to evaluate for vertebrobasilar insufficiency as well as check lipid profile hemoglobin A1c.  I recommend he start aspirin 81 mg daily for stroke prevention and maintain aggressive risk factor modification with strict control of hypertension with blood pressure goal below 130/90, lipids with LDL cholesterol goal below 70 mg percent and diabetes with hemoglobin A1c goal below 6.5%.  He was also  encouraged to eat a healthy diet with lots of fruits, vegetables, cereals and whole grains to exercise regularly.  I also advised him to lose weight.  He is also complaining of nocturnal paresthesias in both hands as well as some intermittent low back pain and left leg sciatic pain.  Recommend he wear wrist extension splints at both wrists during the day and night as well as try gabapentin 300 mg at night for his paresthesias.  Greater than 50% time during this 45-minute consultation visit was spent on counseling and coordination of care about his episode of TIA and numbness and answering questions.  He will return for follow-up in the future in 3 months or call earlier if necessary. Antony Contras, MD  [facility] [address] [place], [place] [address]  Phone [phone] Fax [phone] Note: This document was prepared with digital dictation and possible smart phrase technology. Any transcriptional errors that result from this process are unintentional.

## 2019-11-10 NOTE — Telephone Encounter (Signed)
health team order sent to GI. No auth they will reach out to the patient to schedule.  

## 2019-11-11 LAB — LIPID PANEL
Chol/HDL Ratio: 3.7 ratio (ref 0.0–5.0)
Cholesterol, Total: 146 mg/dL (ref 100–199)
HDL: 39 mg/dL — ABNORMAL LOW (ref >39)
LDL Chol Calc (NIH): 76 mg/dL (ref 0–99)
Triglycerides: 185 mg/dL — ABNORMAL HIGH (ref 0–149)
VLDL Cholesterol Cal: 31 mg/dL (ref 5–40)

## 2019-11-11 LAB — HEMOGLOBIN A1C
Est. average glucose Bld gHb Est-mCnc: 100 mg/dL
Hgb A1c MFr Bld: 5.1 % (ref 4.8–5.6)

## 2019-11-13 NOTE — Progress Notes (Signed)
Kindly inform the patient that cholesterol profile shows triglycerides and bad cholesterol are borderline and screening blood work for diabetes is satisfactory

## 2019-11-17 ENCOUNTER — Telehealth: Payer: Self-pay | Admitting: Emergency Medicine

## 2019-11-17 NOTE — Telephone Encounter (Signed)
Called and spoke to patient, discussed Dr. Clydene Fake review of blood work.  Patient denied any questions and expressed appreciation

## 2019-11-17 NOTE — Telephone Encounter (Signed)
-----   Message from Garvin Fila, MD sent at 11/13/2019 12:28 PM EDT ----- Mitchell Heir inform the patient that cholesterol profile shows triglycerides and bad cholesterol are borderline and screening blood work for diabetes is satisfactory

## 2019-11-24 DIAGNOSIS — G43A0 Cyclical vomiting, not intractable: Secondary | ICD-10-CM | POA: Diagnosis not present

## 2019-11-24 DIAGNOSIS — E7800 Pure hypercholesterolemia, unspecified: Secondary | ICD-10-CM | POA: Diagnosis not present

## 2019-11-24 DIAGNOSIS — Z125 Encounter for screening for malignant neoplasm of prostate: Secondary | ICD-10-CM | POA: Diagnosis not present

## 2019-11-24 DIAGNOSIS — Z23 Encounter for immunization: Secondary | ICD-10-CM | POA: Diagnosis not present

## 2019-11-24 DIAGNOSIS — Z8673 Personal history of transient ischemic attack (TIA), and cerebral infarction without residual deficits: Secondary | ICD-10-CM | POA: Diagnosis not present

## 2019-11-24 DIAGNOSIS — K219 Gastro-esophageal reflux disease without esophagitis: Secondary | ICD-10-CM | POA: Diagnosis not present

## 2019-11-24 DIAGNOSIS — Z0000 Encounter for general adult medical examination without abnormal findings: Secondary | ICD-10-CM | POA: Diagnosis not present

## 2019-11-24 DIAGNOSIS — R030 Elevated blood-pressure reading, without diagnosis of hypertension: Secondary | ICD-10-CM | POA: Diagnosis not present

## 2019-11-24 DIAGNOSIS — M179 Osteoarthritis of knee, unspecified: Secondary | ICD-10-CM | POA: Diagnosis not present

## 2019-11-24 DIAGNOSIS — R0789 Other chest pain: Secondary | ICD-10-CM | POA: Diagnosis not present

## 2019-11-24 DIAGNOSIS — Z1389 Encounter for screening for other disorder: Secondary | ICD-10-CM | POA: Diagnosis not present

## 2019-11-24 DIAGNOSIS — G4733 Obstructive sleep apnea (adult) (pediatric): Secondary | ICD-10-CM | POA: Diagnosis not present

## 2019-12-01 ENCOUNTER — Ambulatory Visit (INDEPENDENT_AMBULATORY_CARE_PROVIDER_SITE_OTHER): Payer: PPO | Admitting: Neurology

## 2019-12-01 ENCOUNTER — Encounter: Payer: Self-pay | Admitting: Neurology

## 2019-12-01 ENCOUNTER — Ambulatory Visit: Payer: PPO | Admitting: Neurology

## 2019-12-01 DIAGNOSIS — G5603 Carpal tunnel syndrome, bilateral upper limbs: Secondary | ICD-10-CM | POA: Insufficient documentation

## 2019-12-01 DIAGNOSIS — R200 Anesthesia of skin: Secondary | ICD-10-CM

## 2019-12-01 DIAGNOSIS — M79605 Pain in left leg: Secondary | ICD-10-CM

## 2019-12-01 DIAGNOSIS — M5432 Sciatica, left side: Secondary | ICD-10-CM

## 2019-12-01 HISTORY — DX: Carpal tunnel syndrome, bilateral upper limbs: G56.03

## 2019-12-01 NOTE — Progress Notes (Addendum)
Kindly inform the patient that EMG nerve conduction study shows no evidence of peripheral neuropathy or pinched nerve in the leg but does show evidence of bilateral carpal tunnel that is pinched nerve at the wrist more on the left and to lesser extent on the right.  Advised him to continue wearing wrist extension splint and avoid activities which involve rapid repetitive wrist flexion.  If his symptoms do not resolve with this treatment may consider referral to surgeon in the future.  Keep scheduled follow-up appointment with me.     [place]    Nerve / Sites Muscle Latency Ref. Amplitude Ref. Rel Amp Segments Distance Velocity Ref. Area    ms ms mV mV %  cm m/s m/s mVms  L Median - APB     Wrist APB 7.4 ?4.4 3.8 ?4.0 100 Wrist - APB 7   17.8     Upper arm APB 11.9  3.7  99 Upper arm - Wrist 22 49 ?49 18.6  R Median - APB     Wrist APB 6.8 ?4.4 6.4 ?4.0 100 Wrist - APB 7   21.9     Upper arm APB 11.4  5.6  87.4 Upper arm - Wrist 23 50 ?49 19.4  L Ulnar - ADM     Wrist ADM 2.9 ?3.3 8.8 ?6.0 100 Wrist - ADM 7   32.2     B.Elbow ADM 6.6  8.4  95.2 B.Elbow - Wrist 19 52 ?49 30.2     A.Elbow ADM 8.5  8.1  96.2 A.Elbow - B.Elbow 10 51 ?49 29.6         A.Elbow - Wrist      L Peroneal - EDB     Ankle EDB 5.1 ?6.5 5.1 ?2.0 100 Ankle - EDB 9   18.5     Fib head EDB 10.8  4.2  83.7 Fib head - Ankle 27 47 ?44 15.9     Pop fossa EDB 12.9  4.2  98.2 Pop fossa - Fib head 10 48 ?44 17.7         Pop fossa - Ankle      L Tibial - AH     Ankle AH 4.6 ?5.8 7.9 ?4.0 100 Ankle - AH 9   15.5     Pop fossa AH 13.1  4.5  56.9 Pop fossa - Ankle 37 44 ?41 13.9               SNC    Nerve / Sites Rec. Site Peak Lat Ref.  Amp Ref. Segments Distance    ms ms V V  cm  L Sural - Ankle (Calf)     Calf Ankle 3.9 ?4.4 6 ?6 Calf - Ankle 14  L Superficial peroneal - Ankle     Lat leg Ankle 4.2 ?4.4 6 ?6 Lat leg - Ankle 14  L Median - Orthodromic (Dig II, Mid palm)     Dig II Wrist NR ?3.4 NR ?10 Dig II - Wrist 13  R  Median - Orthodromic (Dig II, Mid palm)     Dig II Wrist NR ?3.4 NR ?10 Dig II - Wrist 13  L Ulnar - Orthodromic, (Dig V, Mid palm)     Dig V Wrist 3.0 ?3.1 5 ?5 Dig V - Wrist 23               F  Wave    Nerve F Lat Ref.   ms ms  L Tibial - AH  53.1 ?56.0  L Ulnar - ADM 28.3 ?32.0         EMG Summary Table    Spontaneous MUAP Recruitment  Muscle IA Fib PSW Fasc Other Amp Dur. Poly Pattern

## 2019-12-01 NOTE — Procedures (Signed)
     HISTORY:  Terry Roy is a 69 year old gentleman with a 1 year history of some numbness in the hands that bothers him mainly at night.  He also reports some discomfort going down the back of the leg on the left to the foot that comes and goes, also present for about a year.  The patient is being evaluated for a neuropathy or possible radiculopathy.  NERVE CONDUCTION STUDIES:  Nerve conduction studies were performed on both upper extremities.  The distal motor latencies for the median nerves were prolonged bilaterally with a low motor amplitude on the left median nerve, normal on the right.  The distal motor latency and motor amplitudes for the left ulnar nerve were normal.  Nerve conduction velocities for the median nerves bilaterally and for the left ulnar nerve were normal.  Sensory latencies for the median nerves were unobtainable bilaterally and were normal for the left ulnar nerve.  The left ulnar F-wave latency was normal.  Nerve conduction studies were performed on the left lower extremity.  The distal motor latencies and motor amplitudes for the left peroneal and posterior tibial nerves were normal with normal nerve conduction velocities seen for these nerves.  The sensory latencies for the left sural and peroneal nerves were normal with a normal F-wave latency of the left posterior tibial nerve.  EMG STUDIES:  EMG study was performed on the left lower extremity:  The tibialis anterior muscle reveals 2 to 4K motor units with full recruitment. No fibrillations or positive waves were seen. The peroneus tertius muscle reveals 2 to 4K motor units with full recruitment. No fibrillations or positive waves were seen. The medial gastrocnemius muscle reveals 1 to 3K motor units with full recruitment. No fibrillations or positive waves were seen. The vastus lateralis muscle reveals 2 to 4K motor units with full recruitment. No fibrillations or positive waves were seen. The iliopsoas muscle  reveals 2 to 4K motor units with full recruitment. No fibrillations or positive waves were seen. The biceps femoris muscle (long head) reveals 2 to 4K motor units with full recruitment. No fibrillations or positive waves were seen. The lumbosacral paraspinal muscles were tested at 3 levels, and revealed no abnormalities of insertional activity at all 3 levels tested. There was good relaxation.   IMPRESSION:  Nerve conduction studies done on both upper extremities shows evidence of bilateral carpal tunnel syndrome of mild severity on the right and moderate severity on the left.  Nerve conduction studies of the left lower extremity were unremarkable.  EMG of the left lower extremity was relatively unremarkable without evidence of an overlying lumbosacral radiculopathy.  Jill Alexanders MD 12/01/2019 1:47 PM  [facility] [address] [place], [place] [address]  Phone [phone] Fax [phone]

## 2019-12-01 NOTE — Progress Notes (Signed)
Please refer to EMG and nerve conduction procedure note.  

## 2019-12-02 ENCOUNTER — Telehealth: Payer: Self-pay | Admitting: Emergency Medicine

## 2019-12-02 NOTE — Telephone Encounter (Signed)
-----   Message from Garvin Fila, MD sent at 12/01/2019  4:31 PM EST -----   ----- Message ----- From: Kathrynn Ducking, MD Sent: 12/01/2019   1:52 PM EST To: Garvin Fila, MD

## 2019-12-02 NOTE — Telephone Encounter (Signed)
Called and spoke to patient regarding nerve conduction study and Dr. Clydene Fake findings.  Patient understood to continue wearing wrist extension splint and avoid rapid repetitive motion with wrists.  Advised to keep follow up appointment in January.  Patient verbalized understanding and expressed appreciation.

## 2019-12-03 NOTE — Progress Notes (Signed)
Kindly inform the patient that EMG nerve conduction study shows evidence of mild pinched nerve in the right wrist and moderate on the left from carpal tunnel.  Asked him to avoid activities which involve rapid repetitive wrist flexion and to wear wrist extension splint in the left arm.

## 2019-12-14 ENCOUNTER — Other Ambulatory Visit: Payer: PPO

## 2019-12-15 ENCOUNTER — Telehealth: Payer: Self-pay | Admitting: *Deleted

## 2019-12-15 ENCOUNTER — Other Ambulatory Visit: Payer: Self-pay

## 2019-12-15 ENCOUNTER — Ambulatory Visit
Admission: RE | Admit: 2019-12-15 | Discharge: 2019-12-15 | Disposition: A | Discharge status: Home / Self Care | Payer: PPO | Source: Ambulatory Visit | Attending: Neurology | Admitting: Neurology

## 2019-12-15 DIAGNOSIS — G459 Transient cerebral ischemic attack, unspecified: Secondary | ICD-10-CM | POA: Diagnosis not present

## 2019-12-15 DIAGNOSIS — R200 Anesthesia of skin: Secondary | ICD-10-CM | POA: Diagnosis not present

## 2019-12-15 DIAGNOSIS — R42 Dizziness and giddiness: Secondary | ICD-10-CM | POA: Diagnosis not present

## 2019-12-15 MED ORDER — GADOBENATE DIMEGLUMINE 529 MG/ML IV SOLN
20.0000 mL | Freq: Once | INTRAVENOUS | Status: AC | PRN
  Administered 2019-12-15: 20 mL via INTRAVENOUS

## 2019-12-15 NOTE — Telephone Encounter (Signed)
Called patient, LVM  informing patient that both MRA of neck and brain blood vessels was normal and did not show any blockages to worry about.  Left # for questions.

## 2019-12-15 NOTE — Progress Notes (Signed)
Kindly inform patient that both MRA of neck and brain blood vessels was normal and did not show any blocjages to worry about

## 2019-12-23 DIAGNOSIS — Z6835 Body mass index (BMI) 35.0-35.9, adult: Secondary | ICD-10-CM | POA: Diagnosis not present

## 2019-12-23 DIAGNOSIS — K929 Disease of digestive system, unspecified: Secondary | ICD-10-CM | POA: Diagnosis not present

## 2019-12-23 DIAGNOSIS — R110 Nausea: Secondary | ICD-10-CM | POA: Diagnosis not present

## 2019-12-24 DIAGNOSIS — M179 Osteoarthritis of knee, unspecified: Secondary | ICD-10-CM | POA: Diagnosis not present

## 2019-12-24 DIAGNOSIS — E7800 Pure hypercholesterolemia, unspecified: Secondary | ICD-10-CM | POA: Diagnosis not present

## 2019-12-24 DIAGNOSIS — K219 Gastro-esophageal reflux disease without esophagitis: Secondary | ICD-10-CM | POA: Diagnosis not present

## 2019-12-24 DIAGNOSIS — I10 Essential (primary) hypertension: Secondary | ICD-10-CM | POA: Diagnosis not present

## 2020-01-14 DIAGNOSIS — I10 Essential (primary) hypertension: Secondary | ICD-10-CM | POA: Diagnosis not present

## 2020-01-14 DIAGNOSIS — Z5181 Encounter for therapeutic drug level monitoring: Secondary | ICD-10-CM | POA: Diagnosis not present

## 2020-01-20 DIAGNOSIS — I10 Essential (primary) hypertension: Secondary | ICD-10-CM | POA: Diagnosis not present

## 2020-01-21 DIAGNOSIS — I10 Essential (primary) hypertension: Secondary | ICD-10-CM | POA: Diagnosis not present

## 2020-02-02 DIAGNOSIS — H5213 Myopia, bilateral: Secondary | ICD-10-CM | POA: Diagnosis not present

## 2020-02-02 DIAGNOSIS — H52203 Unspecified astigmatism, bilateral: Secondary | ICD-10-CM | POA: Diagnosis not present

## 2020-02-02 DIAGNOSIS — H11153 Pinguecula, bilateral: Secondary | ICD-10-CM | POA: Diagnosis not present

## 2020-02-15 ENCOUNTER — Encounter: Payer: Self-pay | Admitting: Neurology

## 2020-02-15 ENCOUNTER — Ambulatory Visit: Payer: PPO | Admitting: Neurology

## 2020-02-15 VITALS — BP 136/80 | HR 81 | Ht 67.0 in | Wt 230.8 lb

## 2020-02-15 DIAGNOSIS — G5603 Carpal tunnel syndrome, bilateral upper limbs: Secondary | ICD-10-CM

## 2020-02-15 MED ORDER — TOPIRAMATE 50 MG PO TABS: 50.0000 mg | ORAL_TABLET | Freq: Every evening | ORAL | 5 refills | Status: DC | PRN

## 2020-02-15 NOTE — Patient Instructions (Signed)
I had a long discussion with the patient regarding his hand paresthesias and carpal tunnel and answered questions.  He had some trouble tolerating gabapentin hence I recommend we change to Topamax 50 mg at bedtime and advised him to avoid activities which involve rapid repetitive wrist flexion and to wear wrist extension splint at night.  He will continue on aspirin for stroke prevention and maintain aggressive risk factor modification with strict control of hypertension and blood pressure goal below 140/90 lipids with LDL cholesterol goal below 70 mg percent return for follow-up in the future in 6 months or call earlier if necessary.

## 2020-02-15 NOTE — Progress Notes (Signed)
[facility] [address] [place]. [place] [address] [phone]       OFFICE FOLLOW UP VISIT NOTE  [name] Date of Birth:  April 07, 1950 Medical Record Number:  [id number]   Referring MD: Lavone Orn Reason for Referral: Lightheadedness  HPI: Initial visit 11/10/2019 [name] is a 70 year old pleasant Caucasian male with past medical history of arthritis, gastroesophageal reflux disease irritable bowel syndrome who is seen today for initial office consultation visit.  History is obtained from the patient and review of referral notes and electronic medical records and available imaging films in PACS. He states that and February 2021 while he was working out at [employer] and noticed sudden onset of right arm and leg weakness and numbness and had trouble lifting his leg and putting weight on it this lasted about 5 minutes he also felt he was off balance.  His whole right side felt heavy but this resolved in 5 minutes.  He did not mention this to his doctor right away and but did mention it later on subsequently had an outpatient MRI scan of the brain done on 04/22/2019 showed no acute abnormality and showed mild changes of small vessel disease and was unchanged from a previous MRI from 2019.  He had outpatient carotid ultrasound done on 03/27/2019 which showed no significant extracranial stenosis.  Patient also complained symptoms of intermittent lightheadedness and a sensation of feeling to the left when he first gets up this happened once when he was getting out of a car with last 2 minutes he had to hold on but did not fall.  This may occur intermittently off and on but it is transient and he is never fallen or hurt himself.  He denies accompanying symptoms in the form of nausea vomiting vertigo headache or blurred vision.  He has had no previous definite history of stroke TIA.  On inquiry he also complains of intermittent pain in his hands which often wakes him up from  sleep.  This feels better when he shakes his hand.  He has never been evaluated for carpal tunnel.  He also has some intermittent low chronic back pain with occasional shooting pain going down the back and lateral aspect of his left leg.  Years ago he had some x-rays and was told he has a bulging disc. Update 02/15/2020: He returns for follow-up after initial visit 3 months ago.  Patient states that the carpal tunnel splints seem to have helped his hand pain and paresthesias however he still wakes up in a few nights with discomfort in his hand.  He took gabapentin only for few days and then he read the side effects and noticed some pain in his groin and blamed this on it and stopped it.  He states his back pain and sciatica pain appears to be much improved.  He is doing some back stretching exercises which seem to have helped.  He did undergo EMG nerve conduction study done by Dr. Jannifer Franklin on 12/01/2019 which confirmed moderate left and mild right carpal tunnel and did not show any evidence of significant radiculopathy or neuropathy.  MRI of the brain and neck both done on 12/15/2019 showed no significant intracranial extra cranial stenosis.  Lab work on 11/10/2019 showed LDL cholesterol to be 76 mg percent and hemoglobin A1c 5.1.  He has no new complaints today. ROS:   14 system review of systems is positive for tingling, numbness, lightheadedness, imbalance all other systems negative  PMH:  Past  Medical History:  Diagnosis Date  . Arthritis   . Bilateral carpal tunnel syndrome 12/01/2019  . Dilated aortic root ([place])    34mm by echo 02/2017  . GERD (gastroesophageal reflux disease)   . IBS (irritable bowel syndrome)   . 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 Stroke [facility]) 02/2019   TIA  . Wears glasses     Social History:  Social History   Socioeconomic History  . Marital status: Married    Spouse name: Not on file  . Number of children: Not on file  . Years of education: Not on  file  . Highest education level: Not on file  Occupational History  . Occupation: Retired  Tobacco Use  . Smoking status: Never Smoker  . Smokeless tobacco: Never Used  Substance and Sexual Activity  . Alcohol use: Yes    Comment: socially  . Drug use: No  . Sexual activity: Not on file  Other Topics Concern  . Not on file  Social History Narrative   Lives with wife   Right Handed   Drinks 1-2 cups caffeine daily   Social Determinants of Health   Financial Resource Strain: Not on file  Food Insecurity: Not on file  Transportation Needs: Not on file  Physical Activity: Not on file  Stress: Not on file  Social Connections: Not on file  Intimate Partner Violence: Not on file    Medications:   Current Outpatient Medications on File Prior to Visit  Medication Sig Dispense Refill  . amLODipine (NORVASC) 5 MG tablet Take 5 mg by mouth daily.    Marland Kitchen aspirin EC 81 MG tablet Take 1 tablet (81 mg total) by mouth daily. Swallow whole. 30 tablet 11  . escitalopram (LEXAPRO) 10 MG tablet Take 10 mg by mouth 2 (two) times daily.     . Evolocumab (REPATHA SURECLICK) 782 MG/ML SOAJ Inject into the skin.    . fish oil-omega-3 fatty acids 1000 MG capsule Take 2 g by mouth daily.    . Glucosamine-Chondroitin (GLUCOSAMINE CHONDR COMPLEX PO) Take 2,400-3,000 mg by mouth 2 (two) times daily.    . mirtazapine (REMERON) 30 MG tablet Take 30 mg by mouth at bedtime.     Marland Kitchen omeprazole (PRILOSEC) 20 MG capsule Take 20 mg by mouth 2 (two) times daily before a meal.    . telmisartan (MICARDIS) 40 MG tablet Take 40 mg by mouth daily.    . celecoxib (CELEBREX) 200 MG capsule Take 200 mg by mouth daily as needed. Anti inflammatory    . gabapentin (NEURONTIN) 300 MG capsule Take 1 capsule (300 mg total) by mouth at bedtime. 30 capsule 2   No current facility-administered medications on file prior to visit.    Allergies:  No Known Allergies  Physical Exam General: Mildly obese middle-aged Caucasian male  seated, in no evident distress Head: head normocephalic and atraumatic.   Neck: supple with no carotid or supraclavicular bruits Cardiovascular: regular rate and rhythm, no murmurs Musculoskeletal: no deformity.  Straight leg raising test is negative. Skin:  no rash/petichiae Vascular:  Normal pulses all extremities  Neurologic Exam Mental Status: Awake and fully alert. Oriented to place and time. Recent and remote memory intact. Attention span, concentration and fund of knowledge appropriate. Mood and affect appropriate.  Cranial Nerves: Fundoscopic exam not done. Pupils equal, briskly reactive to light. Extraocular movements full without nystagmus. Visual fields full to confrontation. Hearing intact. Facial sensation intact. Face, tongue, palate moves  normally and symmetrically.  Motor: Normal bulk and tone. Normal strength in all tested extremity muscles. Sensory.: intact to touch , pinprick , position and vibratory sensation.  Tinel's test is negative over both wrists. Coordination: Rapid alternating movements normal in all extremities. Finger-to-nose and heel-to-shin performed accurately bilaterally. Gait and Station: Arises from chair without difficulty. Stance is normal. Gait demonstrates normal stride length and balance . Able to heel, toe and tandem walk without difficulty.  Reflexes: 1+ and symmetric. Toes downgoing.     ASSESSMENT: 70 year old Caucasian male with transient episode of right-sided numbness and weakness in February 2021 likely left hemispheric TIA from small vessel disease.  Is also had some recurrent transient episodes of lightheadedness and vertebrobasilar ischemia is a consideration.  Chronic longstanding nocturnal hand paresthesias from carpal tunnel and intermittent left leg radicular pain from sciatica     PLAN: I had a long discussion with the patient regarding his hand paresthesias and carpal tunnel and answered questions.  He had some trouble tolerating  gabapentin hence I recommend we change to Topamax 50 mg at bedtime and advised him to avoid activities which involve rapid repetitive wrist flexion and to wear wrist extension splint at night.  He will continue on aspirin for stroke prevention and maintain aggressive risk factor modification with strict control of hypertension and blood pressure goal below 140/90 lipids with LDL cholesterol goal below 70 mg percent return for follow-up in the future in 6 months or call earlier if necessary. Greater than 50% time during this 35-minute visit was spent on counseling and coordination of care about his episode of TIA and numbness and answering questions.  Antony Contras, MD  [facility] [address] [place],  [place] [address]  Phone [phone] Fax [phone] Note: This document was prepared with digital dictation and possible smart phrase technology. Any transcriptional errors that result from this process are unintentional.

## 2020-02-16 DIAGNOSIS — I10 Essential (primary) hypertension: Secondary | ICD-10-CM | POA: Diagnosis not present

## 2020-02-16 DIAGNOSIS — R002 Palpitations: Secondary | ICD-10-CM | POA: Diagnosis not present

## 2020-02-16 DIAGNOSIS — R0789 Other chest pain: Secondary | ICD-10-CM | POA: Diagnosis not present

## 2020-03-01 DIAGNOSIS — D2261 Melanocytic nevi of right upper limb, including shoulder: Secondary | ICD-10-CM | POA: Diagnosis not present

## 2020-03-01 DIAGNOSIS — D2262 Melanocytic nevi of left upper limb, including shoulder: Secondary | ICD-10-CM | POA: Diagnosis not present

## 2020-03-01 DIAGNOSIS — D485 Neoplasm of uncertain behavior of skin: Secondary | ICD-10-CM | POA: Diagnosis not present

## 2020-03-01 DIAGNOSIS — L820 Inflamed seborrheic keratosis: Secondary | ICD-10-CM | POA: Diagnosis not present

## 2020-03-01 DIAGNOSIS — B078 Other viral warts: Secondary | ICD-10-CM | POA: Diagnosis not present

## 2020-03-01 DIAGNOSIS — Z1283 Encounter for screening for malignant neoplasm of skin: Secondary | ICD-10-CM | POA: Diagnosis not present

## 2020-03-01 DIAGNOSIS — D225 Melanocytic nevi of trunk: Secondary | ICD-10-CM | POA: Diagnosis not present

## 2020-03-07 DIAGNOSIS — R0789 Other chest pain: Secondary | ICD-10-CM | POA: Diagnosis not present

## 2020-03-07 DIAGNOSIS — K219 Gastro-esophageal reflux disease without esophagitis: Secondary | ICD-10-CM | POA: Diagnosis not present

## 2020-03-07 DIAGNOSIS — I10 Essential (primary) hypertension: Secondary | ICD-10-CM | POA: Diagnosis not present

## 2020-03-10 DIAGNOSIS — G4733 Obstructive sleep apnea (adult) (pediatric): Secondary | ICD-10-CM | POA: Diagnosis not present

## 2020-03-16 DIAGNOSIS — M179 Osteoarthritis of knee, unspecified: Secondary | ICD-10-CM | POA: Diagnosis not present

## 2020-03-16 DIAGNOSIS — E7800 Pure hypercholesterolemia, unspecified: Secondary | ICD-10-CM | POA: Diagnosis not present

## 2020-03-16 DIAGNOSIS — K219 Gastro-esophageal reflux disease without esophagitis: Secondary | ICD-10-CM | POA: Diagnosis not present

## 2020-03-16 DIAGNOSIS — I10 Essential (primary) hypertension: Secondary | ICD-10-CM | POA: Diagnosis not present

## 2020-05-17 DIAGNOSIS — L820 Inflamed seborrheic keratosis: Secondary | ICD-10-CM | POA: Diagnosis not present

## 2020-05-17 DIAGNOSIS — M179 Osteoarthritis of knee, unspecified: Secondary | ICD-10-CM | POA: Diagnosis not present

## 2020-05-17 DIAGNOSIS — K219 Gastro-esophageal reflux disease without esophagitis: Secondary | ICD-10-CM | POA: Diagnosis not present

## 2020-05-17 DIAGNOSIS — I10 Essential (primary) hypertension: Secondary | ICD-10-CM | POA: Diagnosis not present

## 2020-05-17 DIAGNOSIS — E7800 Pure hypercholesterolemia, unspecified: Secondary | ICD-10-CM | POA: Diagnosis not present

## 2020-05-17 DIAGNOSIS — B078 Other viral warts: Secondary | ICD-10-CM | POA: Diagnosis not present

## 2020-05-17 DIAGNOSIS — D485 Neoplasm of uncertain behavior of skin: Secondary | ICD-10-CM | POA: Diagnosis not present

## 2020-05-24 DIAGNOSIS — H6690 Otitis media, unspecified, unspecified ear: Secondary | ICD-10-CM | POA: Diagnosis not present

## 2020-05-24 DIAGNOSIS — M898X7 Other specified disorders of bone, ankle and foot: Secondary | ICD-10-CM | POA: Diagnosis not present

## 2020-05-25 DIAGNOSIS — K929 Disease of digestive system, unspecified: Secondary | ICD-10-CM | POA: Diagnosis not present

## 2020-05-25 DIAGNOSIS — Z6834 Body mass index (BMI) 34.0-34.9, adult: Secondary | ICD-10-CM | POA: Diagnosis not present

## 2020-05-30 ENCOUNTER — Ambulatory Visit: Payer: PPO | Admitting: Podiatry

## 2020-05-31 ENCOUNTER — Other Ambulatory Visit: Payer: Self-pay

## 2020-05-31 ENCOUNTER — Ambulatory Visit (INDEPENDENT_AMBULATORY_CARE_PROVIDER_SITE_OTHER): Payer: PPO

## 2020-05-31 ENCOUNTER — Other Ambulatory Visit: Payer: Self-pay | Admitting: Podiatry

## 2020-05-31 ENCOUNTER — Ambulatory Visit: Payer: PPO | Admitting: Podiatry

## 2020-05-31 ENCOUNTER — Encounter: Payer: Self-pay | Admitting: Podiatry

## 2020-05-31 DIAGNOSIS — I700 Atherosclerosis of aorta: Secondary | ICD-10-CM | POA: Insufficient documentation

## 2020-05-31 DIAGNOSIS — M21621 Bunionette of right foot: Secondary | ICD-10-CM | POA: Diagnosis not present

## 2020-05-31 DIAGNOSIS — M545 Low back pain, unspecified: Secondary | ICD-10-CM | POA: Insufficient documentation

## 2020-05-31 DIAGNOSIS — M5030 Other cervical disc degeneration, unspecified cervical region: Secondary | ICD-10-CM | POA: Insufficient documentation

## 2020-05-31 DIAGNOSIS — M7751 Other enthesopathy of right foot: Secondary | ICD-10-CM | POA: Diagnosis not present

## 2020-05-31 DIAGNOSIS — R1115 Cyclical vomiting syndrome unrelated to migraine: Secondary | ICD-10-CM | POA: Insufficient documentation

## 2020-05-31 DIAGNOSIS — G4733 Obstructive sleep apnea (adult) (pediatric): Secondary | ICD-10-CM | POA: Insufficient documentation

## 2020-05-31 DIAGNOSIS — D200 Benign neoplasm of soft tissue of retroperitoneum: Secondary | ICD-10-CM | POA: Insufficient documentation

## 2020-05-31 DIAGNOSIS — D201 Benign neoplasm of soft tissue of peritoneum: Secondary | ICD-10-CM | POA: Insufficient documentation

## 2020-05-31 DIAGNOSIS — M778 Other enthesopathies, not elsewhere classified: Secondary | ICD-10-CM

## 2020-05-31 DIAGNOSIS — Z6835 Body mass index (BMI) 35.0-35.9, adult: Secondary | ICD-10-CM | POA: Insufficient documentation

## 2020-05-31 DIAGNOSIS — E7800 Pure hypercholesterolemia, unspecified: Secondary | ICD-10-CM | POA: Insufficient documentation

## 2020-05-31 DIAGNOSIS — K219 Gastro-esophageal reflux disease without esophagitis: Secondary | ICD-10-CM | POA: Insufficient documentation

## 2020-05-31 DIAGNOSIS — I10 Essential (primary) hypertension: Secondary | ICD-10-CM | POA: Insufficient documentation

## 2020-05-31 DIAGNOSIS — M21622 Bunionette of left foot: Secondary | ICD-10-CM

## 2020-05-31 DIAGNOSIS — R202 Paresthesia of skin: Secondary | ICD-10-CM | POA: Insufficient documentation

## 2020-05-31 DIAGNOSIS — Z8673 Personal history of transient ischemic attack (TIA), and cerebral infarction without residual deficits: Secondary | ICD-10-CM | POA: Insufficient documentation

## 2020-05-31 DIAGNOSIS — J45909 Unspecified asthma, uncomplicated: Secondary | ICD-10-CM | POA: Insufficient documentation

## 2020-05-31 DIAGNOSIS — F32A Depression, unspecified: Secondary | ICD-10-CM | POA: Insufficient documentation

## 2020-05-31 DIAGNOSIS — F411 Generalized anxiety disorder: Secondary | ICD-10-CM | POA: Insufficient documentation

## 2020-05-31 MED ORDER — DEXAMETHASONE SODIUM PHOSPHATE 120 MG/30ML IJ SOLN
2.0000 mg | Freq: Once | INTRAMUSCULAR | Status: AC
  Administered 2020-05-31: 2 mg via INTRA_ARTICULAR

## 2020-05-31 NOTE — Progress Notes (Signed)
Subjective:  Patient ID: Terry [place], male    DOB: 10-28-1950,  MRN: [id number] HPI Chief Complaint  Patient presents with  . Foot Pain    5th MPJ right - aching x 2 years, worsened gradually over the past year, wears orthotics-got last summer, tried Ibuprofen  . New Patient (Initial Visit)    70 y.o. male presents with the above complaint.   ROS: Denies fever chills nausea vomiting muscle aches pains calf pain back pain chest pain shortness of breath.  Past Medical History:  Diagnosis Date  . Arthritis   . Bilateral carpal tunnel syndrome 12/01/2019  . Dilated aortic root ([facility])    54mm by echo 02/2017  . GERD (gastroesophageal reflux disease)   . IBS (irritable bowel syndrome)   . 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 Stroke [facility]) 02/2019   TIA  . Wears glasses    Past Surgical History:  Procedure Laterality Date  . HERNIA REPAIR     ventral hernia   . KNEE ARTHROSCOPY Bilateral   . MASS EXCISION     mesenteric mass resection   . TUMOR REMOVAL  February 2010   benign     Current Outpatient Medications:  .  aspirin EC 81 MG tablet, Take 1 tablet (81 mg total) by mouth daily. Swallow whole., Disp: 30 tablet, Rfl: 11 .  celecoxib (CELEBREX) 200 MG capsule, Take 200 mg by mouth daily as needed. Anti inflammatory, Disp: , Rfl:  .  escitalopram (LEXAPRO) 10 MG tablet, Take 10 mg by mouth 2 (two) times daily. , Disp: , Rfl:  .  Evolocumab (REPATHA SURECLICK) 469 MG/ML SOAJ, Inject into the skin., Disp: , Rfl:  .  fish oil-omega-3 fatty acids 1000 MG capsule, Take 2 g by mouth daily., Disp: , Rfl:  .  Glucosamine-Chondroitin (GLUCOSAMINE CHONDR COMPLEX PO), Take 2,400-3,000 mg by mouth 2 (two) times daily., Disp: , Rfl:  .  mirtazapine (REMERON) 30 MG tablet, Take 30 mg by mouth at bedtime. , Disp: , Rfl:  .  omeprazole (PRILOSEC) 20 MG capsule, Take 20 mg by mouth 2 (two) times daily before a meal., Disp: , Rfl:  .  topiramate (TOPAMAX) 50 MG tablet,  Take 1 tablet (50 mg total) by mouth at bedtime and may repeat dose one time if needed., Disp: 30 tablet, Rfl: 5  Allergies  Allergen Reactions  . Amlodipine Besylate     Other reaction(s): palpitations  . Atorvastatin     Other reaction(s): leg weakness  . Pravastatin Sodium     Other reaction(s): myalgias, stomach upset  . Rosuvastatin     Other reaction(s): nausea, myalgias   Review of Systems Objective:  There were no vitals filed for this visit.  General: Well developed, nourished, in no acute distress, alert and oriented x3   Dermatological: Skin is warm, dry and supple bilateral. Nails x 10 are well maintained; remaining integument appears unremarkable at this time. There are no open sores, no preulcerative lesions, no rash or signs of infection present.  Vascular: Dorsalis Pedis artery and Posterior Tibial artery pedal pulses are 2/4 bilateral with immedate capillary fill time. Pedal hair growth present. No varicosities and no lower extremity edema present bilateral.   Neruologic: Grossly intact via light touch bilateral. Vibratory intact via tuning fork bilateral. Protective threshold with Semmes Wienstein monofilament intact to all pedal sites bilateral. Patellar and Achilles deep tendon reflexes 2+ bilateral. No Babinski or clonus noted bilateral.   Musculoskeletal: No  gross boney pedal deformities bilateral. No pain, crepitus, or limitation noted with foot and ankle range of motion bilateral. Muscular strength 5/5 in all groups tested bilateral.  He has tenderness and fluctuance overlying the dorsal lateral aspect of the fifth metatarsal phalangeal joint of the right foot consistent with bursitis as well as a tailor's bunion deformity.  Gait: Unassisted, Nonantalgic.    Radiographs:  Radiographs taken today demonstrate soft tissue increase in density surrounding the fifth metatarsophalangeal joint of the right foot.  Particularly the lateral head area of the fifth  metatarsal.  No fractures are identified.  No acute findings are noted.  Mild lateral deviation at the level of the distal mid diaphysis.  This is resulted in a tailors bunion deformity.  Assessment & Plan:   Assessment: Tailor's bunion deformity right bursitis right  Plan: At this point we discussed appropriate shoe gear in the shape of his foot.  We also discussed in great detail today the bursitis this along the lateral aspect most likely associated with shoe gear.  So I injected that today with dexamethasone local anesthetic he tolerated the procedure well.  We did discuss the possible need for surgical intervention he understands that and is going to try to avoid that at all cost.  I will follow-up with him on an as-needed basis.     Lashai Grosch T. [place], [place]

## 2020-06-10 DIAGNOSIS — K219 Gastro-esophageal reflux disease without esophagitis: Secondary | ICD-10-CM | POA: Diagnosis not present

## 2020-06-10 DIAGNOSIS — M179 Osteoarthritis of knee, unspecified: Secondary | ICD-10-CM | POA: Diagnosis not present

## 2020-06-10 DIAGNOSIS — I10 Essential (primary) hypertension: Secondary | ICD-10-CM | POA: Diagnosis not present

## 2020-06-10 DIAGNOSIS — E7800 Pure hypercholesterolemia, unspecified: Secondary | ICD-10-CM | POA: Diagnosis not present

## 2020-06-21 DIAGNOSIS — H1031 Unspecified acute conjunctivitis, right eye: Secondary | ICD-10-CM | POA: Diagnosis not present

## 2020-06-28 DIAGNOSIS — H2000 Unspecified acute and subacute iridocyclitis: Secondary | ICD-10-CM | POA: Diagnosis not present

## 2020-06-28 DIAGNOSIS — H1031 Unspecified acute conjunctivitis, right eye: Secondary | ICD-10-CM | POA: Diagnosis not present

## 2020-07-29 DIAGNOSIS — K219 Gastro-esophageal reflux disease without esophagitis: Secondary | ICD-10-CM | POA: Diagnosis not present

## 2020-07-29 DIAGNOSIS — E7800 Pure hypercholesterolemia, unspecified: Secondary | ICD-10-CM | POA: Diagnosis not present

## 2020-07-29 DIAGNOSIS — I10 Essential (primary) hypertension: Secondary | ICD-10-CM | POA: Diagnosis not present

## 2020-07-29 DIAGNOSIS — M179 Osteoarthritis of knee, unspecified: Secondary | ICD-10-CM | POA: Diagnosis not present

## 2020-08-15 ENCOUNTER — Other Ambulatory Visit: Payer: Self-pay

## 2020-08-15 ENCOUNTER — Ambulatory Visit: Payer: PPO | Admitting: Neurology

## 2020-08-15 ENCOUNTER — Encounter: Payer: Self-pay | Admitting: Neurology

## 2020-08-15 VITALS — BP 140/81 | HR 64 | Ht 68.0 in | Wt 227.4 lb

## 2020-08-15 DIAGNOSIS — Z8673 Personal history of transient ischemic attack (TIA), and cerebral infarction without residual deficits: Secondary | ICD-10-CM | POA: Diagnosis not present

## 2020-08-15 DIAGNOSIS — G5603 Carpal tunnel syndrome, bilateral upper limbs: Secondary | ICD-10-CM | POA: Diagnosis not present

## 2020-08-15 DIAGNOSIS — M5432 Sciatica, left side: Secondary | ICD-10-CM | POA: Diagnosis not present

## 2020-08-15 MED ORDER — PREGABALIN 75 MG PO CAPS: 75.0000 mg | ORAL_CAPSULE | Freq: Two times a day (BID) | ORAL | 3 refills | Status: DC

## 2020-08-15 NOTE — Progress Notes (Signed)
[facility] [address] [place]. [place] [address] [phone]       OFFICE FOLLOW UP VISIT NOTE  [name] Date of Birth:  [birth year] Medical Record Number:  [id number]   Referring MD: Terry [place] Reason for Referral: Lightheadedness  HPI: Initial visit 11/10/2019 Terry [place] is a 70 year old pleasant Caucasian male with past medical history of arthritis, gastroesophageal reflux disease irritable bowel syndrome who is seen today for initial office consultation visit.  History is obtained from the patient and review of referral notes and electronic medical records and available imaging films in PACS. He states that and February 2021 while he was working out at [employer] and noticed sudden onset of right arm and leg weakness and numbness and had trouble lifting his leg and putting weight on it this lasted about 5 minutes he also felt he was off balance.  His whole right side felt heavy but this resolved in 5 minutes.  He did not mention this to his doctor right away and but did mention it later on subsequently had an outpatient MRI scan of the brain done on 04/22/2019 showed no acute abnormality and showed mild changes of small vessel disease and was unchanged from a previous MRI from 2019.  He had outpatient carotid ultrasound done on 03/27/2019 which showed no significant extracranial stenosis.  Patient also complained symptoms of intermittent lightheadedness and a sensation of feeling to the left when he first gets up this happened once when he was getting out of a car with last 2 minutes he had to hold on but did not fall.  This may occur intermittently off and on but it is transient and he is never fallen or hurt himself.  He denies accompanying symptoms in the form of nausea vomiting vertigo headache or blurred vision.  He has had no previous definite history of stroke TIA.  On inquiry he also complains of intermittent pain in his hands which often wakes him up from  sleep.  This feels better when he shakes his hand.  He has never been evaluated for carpal tunnel.  He also has some intermittent low chronic back pain with occasional shooting pain going down the back and lateral aspect of his left leg.  Years ago he had some x-rays and was told he has a bulging disc. Update 02/15/2020: He returns for follow-up after initial visit 3 months ago.  Patient states that the carpal tunnel splints seem to have helped his hand pain and paresthesias however he still wakes up in a few nights with discomfort in his hand.  He took gabapentin only for few days and then he read the side effects and noticed some pain in his groin and blamed this on it and stopped it.  He states his back pain and sciatica pain appears to be much improved.  He is doing some back stretching exercises which seem to have helped.  He did undergo EMG nerve conduction study done by Dr. Jannifer Roy on 12/01/2019 which confirmed moderate left and mild right carpal tunnel and did not show any evidence of significant radiculopathy or neuropathy.  MRI of the brain and neck both done on 12/15/2019 showed no significant intracranial extra cranial stenosis.  Lab work on 11/10/2019 showed LDL cholesterol to be 76 mg percent and hemoglobin A1c 5.1.  He has no new complaints today. Update 08/15/2020 : He returns for follow-up after last visit 6 months ago.  He remained stable from neurovascular standpoint without recurrent TIA  or stroke symptoms.  He remains on aspirin which is tolerating well without any bruising or bleeding.  His blood pressure is well controlled.  Remains on Repatha injections and lipid profile will be checked at next visit with his primary physician.  He continues to wear wrist extension splints most nights which seems to help his carpal tunnel symptoms well.  He is more bothered by left leg sciatica pain now and is wondering if TENS unit will help him.  He has a prescription for Topamax but he does not take it  regularly as he feels its not helping much.  He is willing to try Lyrica.  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   Stroke ([facility] 02/2019   TIA   Wears glasses     Social History:  Social History   Socioeconomic History   Marital status: Married    Spouse name: Not on file   Number of children: Not on file   Years of education: Not on file   Highest education level: Not on file  Occupational History   Occupation: Retired  Tobacco Use   Smoking status: Never   Smokeless tobacco: Never  Substance and Sexual Activity   Alcohol use: Yes    Comment: socially   Drug use: No   Sexual activity: Not on file  Other Topics Concern   Not on file  Social History Narrative   Lives with wife   Right Handed   Drinks 1-2 cups caffeine daily   Social Determinants of Health   Financial Resource Strain: Not on file  Food Insecurity: Not on file  Transportation Needs: Not on file  Physical Activity: Not on file  Stress: Not on file  Social Connections: Not on file  Intimate Partner Violence: Not on file    Medications:   Current Outpatient Medications on File Prior to Visit  Medication Sig Dispense Refill   aspirin EC 81 MG tablet Take 1 tablet (81 mg total) by mouth daily. Swallow whole. 30 tablet 11   escitalopram (LEXAPRO) 10 MG tablet Take 10 mg by mouth 2 (two) times daily.      Evolocumab (REPATHA SURECLICK) 1XX123456MG/ML SOAJ Inject into the skin.     fish oil-omega-3 fatty acids 1000 MG capsule Take 2 g by mouth daily.     Glucosamine-Chondroitin (GLUCOSAMINE CHONDR  COMPLEX PO) Take 2,400-3,000 mg by mouth 2 (two) times daily.     mirtazapine (REMERON) 30 MG tablet Take 30 mg by mouth at bedtime.      omeprazole (PRILOSEC) 20 MG capsule Take 20 mg by mouth 2 (two) times daily before a meal.     topiramate (TOPAMAX) 50 MG tablet Take 1 tablet (50 mg total) by mouth at bedtime and may repeat dose one time if needed. 30 tablet 5   No current facility-administered medications on file prior to visit.    Allergies:   Allergies  Allergen Reactions   Amlodipine Besylate     Other reaction(s): palpitations   Atorvastatin     Other reaction(s): leg  weakness   Pravastatin Sodium     Other reaction(s): myalgias, stomach upset   Rosuvastatin     Other reaction(s): nausea, myalgias    Physical Exam General: Mildly obese  but muscular middle-aged Caucasian male seated, in no evident distress Head: head normocephalic and atraumatic.   Neck: supple with no carotid or supraclavicular bruits Cardiovascular: regular rate and rhythm, no murmurs Musculoskeletal: no deformity.  Straight leg raising test is negative. Skin:  no rash/petichiae Vascular:  Normal pulses all extremities  Neurologic Exam Mental Status: Awake and fully alert. Oriented to place and time. Recent and remote memory intact. Attention span, concentration and fund of knowledge appropriate. Mood and affect appropriate.  Cranial Nerves: Fundoscopic exam not done. Pupils equal, briskly reactive to light. Extraocular movements full without nystagmus. Visual fields full to confrontation. Hearing intact. Facial sensation intact. Face, tongue, palate moves normally and symmetrically.  Motor: Normal bulk and tone. Normal strength in all tested extremity muscles. Sensory.: intact to touch , pinprick , position and vibratory sensation.  Tinel's test is negative over both wrists. Coordination: Rapid alternating movements normal in all extremities. Finger-to-nose and heel-to-shin performed accurately  bilaterally. Gait and Station: Arises from chair without difficulty. Stance is normal. Gait demonstrates normal stride length and balance . Able to heel, toe and tandem walk without difficulty.  Reflexes: 1+ and symmetric. Toes downgoing.     ASSESSMENT: 70 year old Caucasian male with transient episode of right-sided numbness and weakness in February 2021 likely left hemispheric TIA from small vessel disease.. Chronic longstanding nocturnal hand paresthesias from carpal tunnel and intermittent left leg radicular pain from sciatica     PLAN: I had a long discussion with the patient regarding his remote history of TIA and he appears to be stable from neurovascular standpoint.  Continue aspirin for stroke prevention and maintain aggressive risk factor modification strict control of hypertension blood pressure goal below 130/90, lipids with LDL cholesterol goal below 70 mg percent and diabetes with hemoglobin A1c goal below 6.5%.  I also advised him to wear wrist extension splints at night for his carpal tunnel which also is seems to be controlled.  He is also complaining of left leg sciatica pain and has not had any benefit with Topamax and gabapentin in the past hence we will try Lyrica 75 mg twice daily to see if it is effective.  He will also continue his regular back exercises and try to lose weight if possible.  He will return for follow-up in the future in a year or call earlier if necessary.Greater than 50% time during this 35-minute visit was spent on counseling and coordination of care about his episode of TIA and numbness and answering questions.  Antony Contras, MD  [facility] [address] [place], [place] [address]  Phone [phone] Fax [phone] Note: This document was prepared with digital dictation and possible smart phrase technology. Any transcriptional errors that result from this process are unintentional.

## 2020-08-15 NOTE — Patient Instructions (Signed)
I had a long discussion with the patient regarding his remote history of TIA and he appears to be stable from neurovascular standpoint.  Continue aspirin for stroke prevention and maintain aggressive risk factor modification strict control of hypertension blood pressure goal below 130/90, lipids with LDL cholesterol goal below 70 mg percent and diabetes with hemoglobin A1c goal below 6.5%.  I also advised him to wear wrist extension splints at night for his carpal tunnel which also is seems to be controlled.  He is also complaining of left leg sciatica pain and has not had any benefit with Topamax and gabapentin in the past hence we will try Lyrica 75 mg twice daily to see if it is effective.  He will also continue his regular back exercises and try to lose weight if possible.  He will return for follow-up in the future in a year or call earlier if necessary. Sciatica  Sciatica is pain, weakness, tingling, or loss of feeling (numbness) along the sciatic nerve. The sciatic nerve starts in the lower back and goes down the back of each leg. Sciatica usually goes away on its own or with treatment. Sometimes, sciatica may come back (recur). What are the causes? This condition happens when the sciatic nerve is pinched or has pressure put on it. This may be the result of: A disk in between the bones of the spine bulging out too far (herniated disk). Changes in the spinal disks that occur with aging. A condition that affects a muscle in the butt. Extra bone growth near the sciatic nerve. A break (fracture) of the area between your hip bones (pelvis). Pregnancy. Tumor. This is rare. What increases the risk? You are more likely to develop this condition if you: Play sports that put pressure or stress on the spine. Have poor strength and ease of movement (flexibility). Have had a back injury in the past. Have had back surgery. Sit for long periods of time. Do activities that involve bending or lifting over  and over again. Are very overweight (obese). What are the signs or symptoms? Symptoms can vary from mild to very bad. They may include: Any of these problems in the lower back, leg, hip, or butt: Mild tingling, loss of feeling, or dull aches. Burning sensations. Sharp pains. Loss of feeling in the back of the calf or the sole of the foot. Leg weakness. Very bad back pain that makes it hard to move. These symptoms may get worse when you cough, sneeze, or laugh. They may alsoget worse when you sit or stand for long periods of time. How is this treated? This condition often gets better without any treatment. However, treatment may include: Changing or cutting back on physical activity when you have pain. Doing exercises and stretching. Putting ice or heat on the affected area. Medicines that help: To relieve pain and swelling. To relax your muscles. Shots (injections) of medicines that help to relieve pain, irritation, and swelling. Surgery. Follow these instructions at home: Medicines Take over-the-counter and prescription medicines only as told by your doctor. Ask your doctor if the medicine prescribed to you: Requires you to avoid driving or using heavy machinery. Can cause trouble pooping (constipation). You may need to take these steps to prevent or treat trouble pooping: Drink enough fluids to keep your pee (urine) pale yellow. Take over-the-counter or prescription medicines. Eat foods that are high in fiber. These include beans, whole grains, and fresh fruits and vegetables. Limit foods that are high in fat and  sugar. These include fried or sweet foods. Managing pain     If told, put ice on the affected area. Put ice in a plastic bag. Place a towel between your skin and the bag. Leave the ice on for 20 minutes, 2-3 times a day. If told, put heat on the affected area. Use the heat source that your doctor tells you to use, such as a moist heat pack or a heating pad. Place a  towel between your skin and the heat source. Leave the heat on for 20-30 minutes. Remove the heat if your skin turns bright red. This is very important if you are unable to feel pain, heat, or cold. You may have a greater risk of getting burned. Activity  Return to your normal activities as told by your doctor. Ask your doctor what activities are safe for you. Avoid activities that make your symptoms worse. Take short rests during the day. When you rest for a long time, do some physical activity or stretching between periods of rest. Avoid sitting for a long time without moving. Get up and move around at least one time each hour. Exercise and stretch regularly, as told by your doctor. Do not lift anything that is heavier than 10 lb (4.5 kg) while you have symptoms of sciatica. Avoid lifting heavy things even when you do not have symptoms. Avoid lifting heavy things over and over. When you lift objects, always lift in a way that is safe for your body. To do this, you should: Bend your knees. Keep the object close to your body. Avoid twisting.  General instructions Stay at a healthy weight. Wear comfortable shoes that support your feet. Avoid wearing high heels. Avoid sleeping on a mattress that is too soft or too hard. You might have less pain if you sleep on a mattress that is firm enough to support your back. Keep all follow-up visits as told by your doctor. This is important. Contact a doctor if: You have pain that: Wakes you up when you are sleeping. Gets worse when you lie down. Is worse than the pain you have had in the past. Lasts longer than 4 weeks. You lose weight without trying. Get help right away if: You cannot control when you pee (urinate) or poop (have a bowel movement). You have weakness in any of these areas and it gets worse: Lower back. The area between your hip bones. Butt. Legs. You have redness or swelling of your back. You have a burning feeling when you  pee. Summary Sciatica is pain, weakness, tingling, or loss of feeling (numbness) along the sciatic nerve. This condition happens when the sciatic nerve is pinched or has pressure put on it. Sciatica can cause pain, tingling, or loss of feeling (numbness) in the lower back, legs, hips, and butt. Treatment often includes rest, exercise, medicines, and putting ice or heat on the affected area. This information is not intended to replace advice given to you by your health care provider. Make sure you discuss any questions you have with your healthcare provider. Document Revised: 01/27/2018 Document Reviewed: 01/27/2018 Elsevier Patient Education  [place].

## 2020-08-29 ENCOUNTER — Other Ambulatory Visit: Payer: Self-pay | Admitting: Neurology

## 2020-08-31 DIAGNOSIS — E7800 Pure hypercholesterolemia, unspecified: Secondary | ICD-10-CM | POA: Diagnosis not present

## 2020-08-31 DIAGNOSIS — K219 Gastro-esophageal reflux disease without esophagitis: Secondary | ICD-10-CM | POA: Diagnosis not present

## 2020-08-31 DIAGNOSIS — I10 Essential (primary) hypertension: Secondary | ICD-10-CM | POA: Diagnosis not present

## 2020-09-13 ENCOUNTER — Encounter: Payer: Self-pay | Admitting: Family Medicine

## 2020-09-13 ENCOUNTER — Ambulatory Visit: Payer: Self-pay

## 2020-09-13 ENCOUNTER — Other Ambulatory Visit: Payer: Self-pay

## 2020-09-13 ENCOUNTER — Ambulatory Visit: Payer: PPO | Admitting: Family Medicine

## 2020-09-13 VITALS — BP 120/78 | HR 63 | Ht 68.0 in | Wt 228.0 lb

## 2020-09-13 DIAGNOSIS — M170 Bilateral primary osteoarthritis of knee: Secondary | ICD-10-CM

## 2020-09-13 DIAGNOSIS — M25561 Pain in right knee: Secondary | ICD-10-CM

## 2020-09-13 DIAGNOSIS — M25562 Pain in left knee: Secondary | ICD-10-CM | POA: Diagnosis not present

## 2020-09-13 NOTE — Assessment & Plan Note (Signed)
Chronic problem.  Has failed viscosupplementation previously.  Had injections today as well as aspiration of the Baker's cyst.  Discussed different treatment options and patient has elected to try this first.  Patient may consider the possibility of custom braces secondary to abnormal calf to thigh ratio.  And instability.  Follow-up with me again in 6 weeks.  Could be a candidate for PRP.

## 2020-09-13 NOTE — Progress Notes (Signed)
[facility] [place] Phone: [phone] Subjective:   Terry Roy, am serving as a scribe for Dr. Hulan Saas.  I'm seeing this patient by the request  of:  Lavone Orn, MD  CC: knee pain   [id number]  Terry Roy is a 70 y.o. male coming in with complaint of B knee pain that has been issues for years but is here because [facility] and doesn't feel like he is getting anything done. Patient has had three rounds of Euflexa which has not helped. States that he has bone on bone in both knees. Patient uses tylenol, OTC topical cream, voltaren gel. Nothing keeps the pain away for long.Left knee pain is posterior knee right knee pain locates pain to medial joint line. Patient states he has a baker cyst behind the right knee.      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   [place]   TIA   Wears glasses    Past Surgical History:  Procedure Laterality Date   HERNIA REPAIR     ventral hernia    KNEE ARTHROSCOPY Bilateral    MASS EXCISION     mesenteric mass resection    TUMOR REMOVAL  February 2010   benign    Social History   Socioeconomic History   Marital status: Married    Spouse name: Not on file   Number of children: Not on file   Years of education: Not on file   Highest education level: Not on file  Occupational History   Occupation: Retired  Tobacco Use   Smoking status: Never   Smokeless tobacco: Never  Substance and Sexual Activity   Alcohol use: Yes    Comment: socially   Drug use: No   Sexual activity: Not on file  Other Topics Concern   Not on file  Social History Narrative   Lives with wife   Right Handed    Drinks 1-2 cups caffeine daily   Social Determinants of Health   Financial Resource Strain: Not on file  Food Insecurity: Not on file  Transportation Needs: Not on file  Physical Activity: Not on file  Stress: Not on file  Social Connections: Not on file   Allergies  Allergen Reactions   Amlodipine Besylate     Other reaction(s): palpitations   Atorvastatin     Other reaction(s): leg weakness   Pravastatin Sodium     Other reaction(s): myalgias, stomach upset   Rosuvastatin     Other reaction(s): nausea, myalgias   Family History  Problem Relation Age of Onset   Osteoporosis Mother    Heart disease Father    Cancer Father        Leukemia   Bullous pemphigoid Sister      Current Outpatient Medications (Cardiovascular):    Evolocumab (REPATHA SURECLICK) [id number]/ML SOAJ, Inject into the skin.   Current Outpatient Medications (Analgesics):    aspirin EC 81 MG tablet, Take 1 tablet (81 mg total) by mouth daily. Swallow whole.   Current Outpatient Medications (Other):    escitalopram (LEXAPRO) 10 MG tablet, Take 10 mg by mouth 2 (two)  times daily.    fish oil-omega-3 fatty acids 1000 MG capsule, Take 2 g by mouth daily.   Glucosamine-Chondroitin (GLUCOSAMINE CHONDR COMPLEX PO), Take 2,400-3,000 mg by mouth 2 (two) times daily.   mirtazapine (REMERON) 30 MG tablet, Take 30 mg by mouth at bedtime.    omeprazole (PRILOSEC) 20 MG capsule, Take 20 mg by mouth 2 (two) times daily before a meal.   pregabalin (LYRICA) 75 MG capsule, Take 1 capsule (75 mg total) by mouth 2 (two) times daily.   Reviewed prior external information including notes and imaging from  primary care provider As well as notes that were available from care everywhere and other healthcare systems.  Past medical history, social, surgical and family history all reviewed in electronic medical record.  No pertanent information unless stated regarding to the chief complaint.   Review of Systems:  No headache,  visual changes, nausea, vomiting, diarrhea, constipation, dizziness, abdominal pain, skin rash, fevers, chills, night sweats, weight loss, swollen lymph nodes, body aches, joint swelling, chest pain, shortness of breath, mood changes. POSITIVE muscle aches  Objective  Blood pressure 120/78, pulse 63, height '5\' 8"'$  (1.727 m), weight 228 lb (103.4 kg), SpO2 97 %.   General: No apparent distress alert and oriented x3 mood and affect normal, dressed appropriately.  HEENT: Pupils equal, extraocular movements intact  Respiratory: Patient's speak in full sentences and does not appear short of breath  Cardiovascular: No lower extremity edema, non tender, no erythema  Gait normal with good balance and coordination.  MSK:  Knee exam shows patient does have significant arthritic changes bilaterally.  No effusion of the right knee.  Lacks the last 5 degrees of extension as well as 10 degrees of flexion on the right.  Large Baker's cyst noted on the right side.  Both knees do have some instability noted with valgus and varus force.  Procedure: Real-time Ultrasound Guided Injection of right knee aspiration of Baker's cyst Device: GE Logiq Q7 Ultrasound guided injection is preferred based studies that show increased duration, increased effect, greater accuracy, decreased procedural pain, increased response rate, and decreased cost with ultrasound guided versus blind injection.  Verbal informed consent obtained.  Time-out conducted.  Noted no overlying erythema, induration, or other signs of local infection.  Skin prepped in a sterile fashion.  Local anesthesia: Topical Ethyl chloride.  With sterile technique and under real time ultrasound guidance: With a 22-gauge 2 inch needle patient was injected with 2 cc of 0.5% Marcaine then aspirated 29 cc of straw light-colored fluid then injected and 1 cc of Kenalog 40 mg/dL. This was from a posterior approach.  Completed without difficulty  Pain immediately resolved  suggesting accurate placement of the medication.  Advised to call if fevers/chills, erythema, induration, drainage, or persistent bleeding.  Impression: Technically successful ultrasound guided injection.  After informed written and verbal consent, patient was seated on exam table. Left knee was prepped with alcohol swab and utilizing anterolateral approach, patient's left knee space was injected with 4:1  marcaine 0.5%: Kenalog '40mg'$ /dL. Patient tolerated the procedure well without immediate complications.   Impression and Recommendations:     The above documentation has been reviewed and is accurate and complete Lyndal Pulley, DO

## 2020-09-13 NOTE — Patient Instructions (Addendum)
Drained R knee today Injected L knee today Exercises Be careful with activity Ice after activity and before bed See me in 6 weeks

## 2020-09-28 DIAGNOSIS — K219 Gastro-esophageal reflux disease without esophagitis: Secondary | ICD-10-CM | POA: Diagnosis not present

## 2020-09-28 DIAGNOSIS — K929 Disease of digestive system, unspecified: Secondary | ICD-10-CM | POA: Diagnosis not present

## 2020-09-28 DIAGNOSIS — Z6834 Body mass index (BMI) 34.0-34.9, adult: Secondary | ICD-10-CM | POA: Diagnosis not present

## 2020-10-10 DIAGNOSIS — M179 Osteoarthritis of knee, unspecified: Secondary | ICD-10-CM | POA: Diagnosis not present

## 2020-10-10 DIAGNOSIS — K219 Gastro-esophageal reflux disease without esophagitis: Secondary | ICD-10-CM | POA: Diagnosis not present

## 2020-10-10 DIAGNOSIS — E7800 Pure hypercholesterolemia, unspecified: Secondary | ICD-10-CM | POA: Diagnosis not present

## 2020-10-10 DIAGNOSIS — I10 Essential (primary) hypertension: Secondary | ICD-10-CM | POA: Diagnosis not present

## 2020-10-18 NOTE — Progress Notes (Signed)
[facility] [place] [address] [place] Phone: [phone] Subjective:   IVilma Meckel, am serving as a scribe for Dr. Hulan Saas. This visit occurred during the SARS-CoV-2 public health emergency.  Safety protocols were in place, including screening questions prior to the visit, additional usage of staff PPE, and extensive cleaning of exam room while observing appropriate contact time as indicated for disinfecting solutions.   I'm seeing this patient by the request  of:  Lavone Orn, MD  CC: Bilateral knee pain right greater than left  [id number]  09/13/2020 Chronic problem.  Has failed viscosupplementation previously.  Had injections today as well as aspiration of the Baker's cyst.  Discussed different treatment options and patient has elected to try this first.  Patient may consider the possibility of custom braces secondary to abnormal calf to thigh ratio.  And instability.  Follow-up with me again in 6 weeks.  Could be a candidate for PRP.  Updated 10/19/2020 EKAM BESSON is a 70 y.o. male coming in with complaint of bilateral knee pain. Left knee is doing much better than right. Only feel tightness after sitting. Right knee the pain has moved from being medial to anterior right around the patella. ADL are good, but weight lifting is still painful. Does a daily regimen of topical cream, ice, and tylenol when needed.     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   Stroke ([place]) 02/2019   TIA   Wears glasses    Past Surgical History:  Procedure Laterality Date   HERNIA REPAIR     ventral hernia    KNEE ARTHROSCOPY Bilateral    MASS EXCISION     mesenteric mass resection    TUMOR REMOVAL   February 2010   benign    Social History   Socioeconomic History   Marital status: Married    Spouse name: Not on file   Number of children: Not on file   Years of education: Not on file   Highest education level: Not on file  Occupational History   Occupation: Retired  Tobacco Use   Smoking status: Never   Smokeless tobacco: Never  Substance and Sexual Activity   Alcohol use: Yes    Comment: socially   Drug use: No   Sexual activity: Not on file  Other Topics Concern   Not on file  Social History Narrative   Lives with wife   Right Handed   Drinks 1-2 cups caffeine daily   Social Determinants of Health   Financial Resource Strain: Not on file  Food Insecurity: Not on file  Transportation Needs: Not on file  Physical Activity: Not on file  Stress: Not on file  Social Connections: Not on file   Allergies  Allergen Reactions   Amlodipine Besylate     Other reaction(s): palpitations   Atorvastatin     Other reaction(s): leg weakness   Pravastatin Sodium     Other reaction(s): myalgias, stomach upset   Rosuvastatin     Other reaction(s): nausea, myalgias   Family History  Problem Relation Age of Onset   Osteoporosis Mother    Heart disease Father    Cancer Father  Leukemia   Bullous pemphigoid Sister      Current Outpatient Medications (Cardiovascular):    Evolocumab (REPATHA SURECLICK) 952 MG/ML SOAJ, Inject into the skin.   Current Outpatient Medications (Analgesics):    aspirin EC 81 MG tablet, Take 1 tablet (81 mg total) by mouth daily. Swallow whole.   Current Outpatient Medications (Other):    escitalopram (LEXAPRO) 10 MG tablet, Take 10 mg by mouth 2 (two) times daily.    fish oil-omega-3 fatty acids 1000 MG capsule, Take 2 g by mouth daily.   Glucosamine-Chondroitin (GLUCOSAMINE CHONDR COMPLEX PO), Take 2,400-3,000 mg by mouth 2 (two) times daily.   mirtazapine (REMERON) 30 MG tablet, Take 30 mg by mouth at bedtime.    omeprazole  (PRILOSEC) 20 MG capsule, Take 20 mg by mouth 2 (two) times daily before a meal.   pregabalin (LYRICA) 75 MG capsule, Take 1 capsule (75 mg total) by mouth 2 (two) times daily.   Reviewed prior external information including notes and imaging from  primary care provider As well as notes that were available from care everywhere and other healthcare systems.  Past medical history, social, surgical and family history all reviewed in electronic medical record.  No pertanent information unless stated regarding to the chief complaint.   Review of Systems:  No headache, visual changes, nausea, vomiting, diarrhea, constipation, dizziness, abdominal pain, skin rash, fevers, chills, night sweats, weight loss, swollen lymph nodes, body aches,, chest pain, shortness of breath, mood changes. POSITIVE muscle aches, joint swelling  Objective  Blood pressure 132/82, pulse (!) 56, height 5\' 8"  (1.727 m), weight 222 lb (100.7 kg), SpO2 96 %.   General: No apparent distress alert and oriented x3 mood and affect normal, dressed appropriately.  HEENT: Pupils equal, extraocular movements intact  Respiratory: Patient's speak in full sentences and does not appear short of breath  Cardiovascular: No lower extremity edema, non tender, no erythema  Gait normal with good balance and coordination.  MSK: Does have arthritic changes of the knees bilaterally.  Crepitus noted right to the left.  Some tenderness of the patellar tendon which is different than previous exam.  No fullness in the popliteal area anymore this time.    Impression and Recommendations:     The above documentation has been reviewed and is accurate and complete Lyndal Pulley, DO

## 2020-10-19 ENCOUNTER — Ambulatory Visit: Payer: PPO | Admitting: Family Medicine

## 2020-10-19 ENCOUNTER — Ambulatory Visit (INDEPENDENT_AMBULATORY_CARE_PROVIDER_SITE_OTHER): Payer: PPO

## 2020-10-19 ENCOUNTER — Other Ambulatory Visit: Payer: Self-pay

## 2020-10-19 VITALS — BP 132/82 | HR 56 | Ht 68.0 in | Wt 222.0 lb

## 2020-10-19 DIAGNOSIS — M1711 Unilateral primary osteoarthritis, right knee: Secondary | ICD-10-CM

## 2020-10-19 DIAGNOSIS — M170 Bilateral primary osteoarthritis of knee: Secondary | ICD-10-CM

## 2020-10-19 DIAGNOSIS — M25561 Pain in right knee: Secondary | ICD-10-CM | POA: Diagnosis not present

## 2020-10-19 NOTE — Assessment & Plan Note (Addendum)
Known arthritic changes of the knees bilaterally but will get a repeat x-ray to further evaluate.  Patient is continuing to have some discomfort discussed which activities to do-strengthening.  Appropriate.  Discussed the potential for TENS unit.  Patient could be candidate for the possibility of PRP as well.  We will get approval for gel injections as well as.  Follow-up with me again 6 weeks

## 2020-10-19 NOTE — Patient Instructions (Addendum)
Xray today Tens unit is good idea Look into vibration plate We'll get approval for gel injection just in case See you again in 6 weeks

## 2020-11-16 DIAGNOSIS — D485 Neoplasm of uncertain behavior of skin: Secondary | ICD-10-CM | POA: Diagnosis not present

## 2020-11-16 DIAGNOSIS — D225 Melanocytic nevi of trunk: Secondary | ICD-10-CM | POA: Diagnosis not present

## 2020-11-16 DIAGNOSIS — B078 Other viral warts: Secondary | ICD-10-CM | POA: Diagnosis not present

## 2020-11-16 DIAGNOSIS — D2272 Melanocytic nevi of left lower limb, including hip: Secondary | ICD-10-CM | POA: Diagnosis not present

## 2020-11-16 DIAGNOSIS — Z1283 Encounter for screening for malignant neoplasm of skin: Secondary | ICD-10-CM | POA: Diagnosis not present

## 2020-11-29 NOTE — Progress Notes (Signed)
Terry Roy [place] [address] [place] Phone: [phone] Subjective:   Terry Roy, am serving as a scribe for Dr. Hulan Saas. This visit occurred during the SARS-CoV-2 public health emergency.  Safety protocols were in place, including screening questions prior to the visit, additional usage of staff PPE, and extensive cleaning of exam room while observing appropriate contact time as indicated for disinfecting solutions.   I'm seeing this patient by the request  of:  Lavone Orn, MD  CC: Bilateral knee pain  SNK:[id number]  10/19/2020 Known arthritic changes of the knees bilaterally but will get a repeat x-ray to further evaluate.  Patient is continuing to have some discomfort discussed which activities to do-strengthening.  Appropriate.  Discussed the potential for TENS unit.  Patient could be candidate for the possibility of PRP as well.  We will get approval for gel injections as well as.  Follow-up with me again 6 weeks  Updated 11/30/2020 Terry Roy is a 70 y.o. male coming in with complaint of bilateral knee pain. Right knee the pain has gone more lateral near fibula head. The left knee the pain is more posterior. Both characterized as uncomfortable, but not sharp. Pain only present when moving around  Xray IMPRESSION: Marked 3 compartment osteoarthritis, without acute superimposed process.   Prior ACL repair.      Past Medical History:  Diagnosis Date   Arthritis    Bilateral carpal tunnel syndrome 12/01/2019   Dilated aortic root (HCC)    88mm by echo 02/2017   GERD (gastroesophageal reflux disease)    IBS (irritable bowel syndrome)    Mesenteric mass    benign fat necrosis and fibrosis   Stroke ([facility]) 02/2019   TIA   Wears glasses    Past Surgical History:  Procedure Laterality Date   HERNIA REPAIR     ventral hernia    KNEE ARTHROSCOPY Bilateral    MASS EXCISION     mesenteric mass resection    TUMOR  REMOVAL  February 2010   benign    Social History   Socioeconomic History   Marital status: Married    Spouse name: Not on file   Number of children: Not on file   Years of education: Not on file   Highest education level: Not on file  Occupational History   Occupation: Retired  Tobacco Use   Smoking status: Never   Smokeless tobacco: Never  Substance and Sexual Activity   Alcohol use: Yes    Comment: socially   Drug use: No   Sexual activity: Not on file  Other Topics Concern   Not on file  Social History Narrative   Lives with wife   Right Handed   Drinks 1-2 cups caffeine daily   Social Determinants of Health   Financial Resource Strain: Not on file  Food Insecurity: Not on file  Transportation Needs: Not on file  Physical Activity: Not on file  Stress: Not on file  Social Connections: Not on file   Allergies  Allergen Reactions   Amlodipine Besylate     Other reaction(s): palpitations   Atorvastatin     Other reaction(s): leg weakness   Pravastatin Sodium     Other reaction(s): myalgias, stomach upset   Rosuvastatin     Other reaction(s): nausea, myalgias   Family History  Problem Relation Age of Onset   Osteoporosis Mother    Heart disease Father    Cancer Father  Leukemia   Bullous pemphigoid Sister      Current Outpatient Medications (Cardiovascular):    Evolocumab (REPATHA SURECLICK) 903 MG/ML SOAJ, Inject into the skin.   Current Outpatient Medications (Analgesics):    aspirin EC 81 MG tablet, Take 1 tablet (81 mg total) by mouth daily. Swallow whole.   Current Outpatient Medications (Other):    escitalopram (LEXAPRO) 10 MG tablet, Take 10 mg by mouth 2 (two) times daily.    fish oil-omega-3 fatty acids 1000 MG capsule, Take 2 g by mouth daily.   Glucosamine-Chondroitin (GLUCOSAMINE CHONDR COMPLEX PO), Take 2,400-3,000 mg by mouth 2 (two) times daily.   mirtazapine (REMERON) 30 MG tablet, Take 30 mg by mouth at bedtime.     omeprazole (PRILOSEC) 20 MG capsule, Take 20 mg by mouth 2 (two) times daily before a meal.   pregabalin (LYRICA) 75 MG capsule, Take 1 capsule (75 mg total) by mouth 2 (two) times daily.   Reviewed prior external information including notes and imaging from  primary care provider As well as notes that were available from care everywhere and other healthcare systems.  Past medical history, social, surgical and family history all reviewed in electronic medical record.  No pertanent information unless stated regarding to the chief complaint.   Review of Systems:  No headache, visual changes, nausea, vomiting, diarrhea, constipation, dizziness, abdominal pain, skin rash, fevers, chills, night sweats, weight loss, swollen lymph nodes, body aches, j chest pain, shortness of breath, mood changes. POSITIVE muscle aches, joint swelling  Objective  Blood pressure 140/82, pulse 72, height 5\' 8"  (1.727 m), weight 223 lb (101.2 kg), SpO2 92 %.   General: No apparent distress alert and oriented x3 mood and affect normal, dressed appropriately.  HEENT: Pupils equal, extraocular movements intact  Respiratory: Patient's speak in full sentences and does not appear short of breath  Cardiovascular: No lower extremity edema, non tender, no erythema  Gait normal with good balance and coordination.  MSK: Bilateral knees do have significant arthritic changes noted.  Patient does have some instability noted with valgus and varus force.  No significant reaccumulation of the Baker's cyst.  After informed written and verbal consent, patient was seated on exam table. Right knee was prepped with alcohol swab and utilizing anterolateral approach, patient's right knee space was injected with 40 mg per 3 mL of Monovisc (sodium hyaluronate) in a prefilled syringe was injected easily into the knee through a 22-gauge needle..Patient tolerated the procedure well without immediate complications.  After informed written and  verbal consent, patient was seated on exam table. Left knee was prepped with alcohol swab and utilizing anterolateral approach, patient's left knee space was injected with 40 mg per 3 mL of Monovisc (sodium hyaluronate) in a prefilled syringe was injected easily into the knee through a 22-gauge needle..Patient tolerated the procedure well without immediate complications.   Impression and Recommendations:    The above documentation has been reviewed and is accurate and complete Lyndal Pulley, DO

## 2020-11-30 ENCOUNTER — Ambulatory Visit: Payer: PPO | Admitting: Family Medicine

## 2020-11-30 ENCOUNTER — Other Ambulatory Visit: Payer: Self-pay

## 2020-11-30 ENCOUNTER — Encounter: Payer: Self-pay | Admitting: Family Medicine

## 2020-11-30 DIAGNOSIS — M170 Bilateral primary osteoarthritis of knee: Secondary | ICD-10-CM | POA: Diagnosis not present

## 2020-11-30 NOTE — Patient Instructions (Signed)
Injection today Good to see you! See you again in 2 months

## 2020-11-30 NOTE — Assessment & Plan Note (Signed)
Bilateral injections given today, tolerated the procedure well, we discussed with patient that this may take some time to a month to see the improvement.  We discussed that in the hopefulness that this will last approximately 6 months.  Patient does have severe arthritic changes and wants to continue to be active.  Discussed icing regimen and home exercises.  Follow-up with me again 6 to 8 weeks.

## 2020-12-01 DIAGNOSIS — E7800 Pure hypercholesterolemia, unspecified: Secondary | ICD-10-CM | POA: Diagnosis not present

## 2020-12-01 DIAGNOSIS — D201 Benign neoplasm of soft tissue of peritoneum: Secondary | ICD-10-CM | POA: Diagnosis not present

## 2020-12-01 DIAGNOSIS — G43A0 Cyclical vomiting, not intractable: Secondary | ICD-10-CM | POA: Diagnosis not present

## 2020-12-01 DIAGNOSIS — R030 Elevated blood-pressure reading, without diagnosis of hypertension: Secondary | ICD-10-CM | POA: Diagnosis not present

## 2020-12-01 DIAGNOSIS — Z8673 Personal history of transient ischemic attack (TIA), and cerebral infarction without residual deficits: Secondary | ICD-10-CM | POA: Diagnosis not present

## 2020-12-01 DIAGNOSIS — Z23 Encounter for immunization: Secondary | ICD-10-CM | POA: Diagnosis not present

## 2020-12-01 DIAGNOSIS — M179 Osteoarthritis of knee, unspecified: Secondary | ICD-10-CM | POA: Diagnosis not present

## 2020-12-01 DIAGNOSIS — Z0000 Encounter for general adult medical examination without abnormal findings: Secondary | ICD-10-CM | POA: Diagnosis not present

## 2020-12-01 DIAGNOSIS — I700 Atherosclerosis of aorta: Secondary | ICD-10-CM | POA: Diagnosis not present

## 2020-12-01 DIAGNOSIS — G4733 Obstructive sleep apnea (adult) (pediatric): Secondary | ICD-10-CM | POA: Diagnosis not present

## 2020-12-01 DIAGNOSIS — Z1389 Encounter for screening for other disorder: Secondary | ICD-10-CM | POA: Diagnosis not present

## 2020-12-01 DIAGNOSIS — K219 Gastro-esophageal reflux disease without esophagitis: Secondary | ICD-10-CM | POA: Diagnosis not present

## 2021-01-27 NOTE — Progress Notes (Signed)
Terry Roy [address] [place] Phone: [phone] Subjective:   Terry Roy, am serving as a scribe for Dr. Hulan Saas. This visit occurred during the SARS-CoV-2 public health emergency.  Safety protocols were in place, including screening questions prior to the visit, additional usage of staff PPE, and extensive cleaning of exam room while observing appropriate contact time as indicated for disinfecting solutions.   I'm seeing this patient by the request  of:  Lavone Orn, MD  CC: Bilateral knee pain follow-up  NTZ:[id number]  11/30/2020 Bilateral injections given today, tolerated the procedure well, we discussed with patient that this may take some time to a month to see the improvement.  We discussed that in the hopefulness that this will last approximately 6 months.  Patient does have severe arthritic changes and wants to continue to be active.  Discussed icing regimen and home exercises.  Follow-up with me again 6 to 8 weeks.  Updated 01/30/2021 Terry Roy is a 71 y.o. male coming in with complaint of bilateral knee pain.  Patient is known arthritic changes of the knees bilaterally.  Bilateral injections given November 9.  Patient states injections did not work at all. Pain is the same or worse, especially in right knee.       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   Stroke ([place]) 02/2019   TIA   Wears glasses    Past Surgical History:  Procedure Laterality Date   HERNIA REPAIR     ventral hernia    KNEE ARTHROSCOPY Bilateral    MASS EXCISION     mesenteric mass resection    TUMOR REMOVAL  February 2010   benign    Social History   Socioeconomic History   Marital  status: Married    Spouse name: Not on file   Number of children: Not on file   Years of education: Not on file   Highest education level: Not on file  Occupational History   Occupation: Retired  Tobacco Use   Smoking status: Never   Smokeless tobacco: Never  Substance and Sexual Activity   Alcohol use: Yes    Comment: socially   Drug use: No   Sexual activity: Not on file  Other Topics Concern   Not on file  Social History Narrative   Lives with wife   Right Handed   Drinks 1-2 cups caffeine daily   Social Determinants of Health   Financial Resource Strain: Not on file  Food Insecurity: Not on file  Transportation Needs: Not on file  Physical Activity: Not on file  Stress: Not on file  Social Connections: Not on file   Allergies  Allergen Reactions   Amlodipine Besylate     Other reaction(s): palpitations   Atorvastatin     Other reaction(s): leg weakness   Pravastatin Sodium     Other reaction(s): myalgias, stomach upset   Rosuvastatin     Other reaction(s): nausea, myalgias   Family History  Problem Relation Age of Onset   Osteoporosis Mother    Heart disease Father    Cancer Father        Leukemia   Bullous pemphigoid Sister  Current Outpatient Medications (Cardiovascular):    Evolocumab (REPATHA SURECLICK) 829 MG/ML SOAJ, Inject into the skin.   Current Outpatient Medications (Analgesics):    aspirin EC 81 MG tablet, Take 1 tablet (81 mg total) by mouth daily. Swallow whole.   Current Outpatient Medications (Other):    escitalopram (LEXAPRO) 10 MG tablet, Take 10 mg by mouth 2 (two) times daily.    fish oil-omega-3 fatty acids 1000 MG capsule, Take 2 g by mouth daily.   Glucosamine-Chondroitin (GLUCOSAMINE CHONDR COMPLEX PO), Take 2,400-3,000 mg by mouth 2 (two) times daily.   mirtazapine (REMERON) 30 MG tablet, Take 30 mg by mouth at bedtime.    omeprazole (PRILOSEC) 20 MG capsule, Take 20 mg by mouth 2 (two) times daily before a meal.    pregabalin (LYRICA) 75 MG capsule, Take 1 capsule (75 mg total) by mouth 2 (two) times daily.    Review of Systems:  No headache, visual changes, nausea, vomiting, diarrhea, constipation, dizziness, abdominal pain, skin rash, fevers, chills, night sweats, weight loss, swollen lymph nodes, body aches, joint swelling, chest pain, shortness of breath, mood changes. POSITIVE muscle aches  Objective  Blood pressure 126/64, pulse 72, height 5\' 8"  (1.727 m), weight 228 lb (103.4 kg), SpO2 96 %.   General: No apparent distress alert and oriented x3 mood and affect normal, dressed appropriately.  HEENT: Pupils equal, extraocular movements intact  Respiratory: Patient's speak in full sentences and does not appear short of breath  Cardiovascular: No lower extremity edema, non tender, no erythema  Gait mild antalgic Bilateral knees do have an effusion noted.  Patient does have some mild limited range of motion.  Instability with valgus and varus force.  Procedure: Real-time Ultrasound Guided Injection of right knee Device: GE Logiq Q7 Ultrasound guided injection is preferred based studies that show increased duration, increased effect, greater accuracy, decreased procedural pain, increased response rate, and decreased cost with ultrasound guided versus blind injection.  Verbal informed consent obtained.  Time-out conducted.  Noted no overlying erythema, induration, or other signs of local infection.  Skin prepped in a sterile fashion.  Local anesthesia: Topical Ethyl chloride.  With sterile technique and under real time ultrasound guidance: With a 22-gauge 2 inch needle patient was injected with 4 cc of 0.5% Marcaine and aspirated 30 cc of straw light-colored fluid then injected 1 cc of Kenalog 40 mg/dL. This was from a posterior approach.  Completed without difficulty  Pain immediately resolved suggesting accurate placement of the medication.  Advised to call if fevers/chills, erythema, induration,  drainage, or persistent bleeding.  Impression: Technically successful ultrasound guided injection.  Procedure: Real-time Ultrasound Guided Injection of left knee Device: GE Logiq Q7 Ultrasound guided injection is preferred based studies that show increased duration, increased effect, greater accuracy, decreased procedural pain, increased response rate, and decreased cost with ultrasound guided versus blind injection.  Verbal informed consent obtained.  Time-out conducted.  Noted no overlying erythema, induration, or other signs of local infection.  Skin prepped in a sterile fashion.  Local anesthesia: Topical Ethyl chloride.  With sterile technique and under real time ultrasound guidance: With a 22-gauge 2 inch needle patient was injected with 4 cc of 0.5% Marcaine and aspirated 35 cc of straw light-colored fluid then injected 1 cc of Kenalog 40 mg/dL. This was from a posterior approach.  Completed without difficulty  Pain immediately resolved suggesting accurate placement of the medication.  Advised to call if fevers/chills, erythema, induration, drainage, or persistent bleeding.  Impression: Technically successful  ultrasound guided injection.   Impression and Recommendations:     The above documentation has been reviewed and is accurate and complete Lyndal Pulley, DO

## 2021-01-31 ENCOUNTER — Ambulatory Visit: Payer: Self-pay

## 2021-01-31 ENCOUNTER — Ambulatory Visit: Payer: PPO | Admitting: Family Medicine

## 2021-01-31 ENCOUNTER — Other Ambulatory Visit: Payer: Self-pay

## 2021-01-31 VITALS — BP 126/64 | HR 72 | Ht 68.0 in | Wt 228.0 lb

## 2021-01-31 DIAGNOSIS — M170 Bilateral primary osteoarthritis of knee: Secondary | ICD-10-CM

## 2021-01-31 NOTE — Patient Instructions (Signed)
Drained cyst today Good to see you! Okay to squat on Thursday See you again in 10 weeks, if not better consider PRP

## 2021-01-31 NOTE — Assessment & Plan Note (Signed)
Patient did have aspiration done today.  Tolerated the procedure well.  Discussed with patient about the other possibility of PRP.  We will see how patient responds to the steroid and he can repeat these every 10 weeks if necessary as well.  Patient otherwise would need to consider the possibility of surgical intervention with replacement.  Follow-up with me again in 10 weeks

## 2021-02-01 DIAGNOSIS — L820 Inflamed seborrheic keratosis: Secondary | ICD-10-CM | POA: Diagnosis not present

## 2021-02-01 DIAGNOSIS — L570 Actinic keratosis: Secondary | ICD-10-CM | POA: Diagnosis not present

## 2021-02-01 DIAGNOSIS — X32XXXD Exposure to sunlight, subsequent encounter: Secondary | ICD-10-CM | POA: Diagnosis not present

## 2021-02-01 DIAGNOSIS — L918 Other hypertrophic disorders of the skin: Secondary | ICD-10-CM | POA: Diagnosis not present

## 2021-02-02 DIAGNOSIS — H5213 Myopia, bilateral: Secondary | ICD-10-CM | POA: Diagnosis not present

## 2021-02-02 DIAGNOSIS — H11153 Pinguecula, bilateral: Secondary | ICD-10-CM | POA: Diagnosis not present

## 2021-02-08 DIAGNOSIS — E7800 Pure hypercholesterolemia, unspecified: Secondary | ICD-10-CM | POA: Diagnosis not present

## 2021-02-08 DIAGNOSIS — I10 Essential (primary) hypertension: Secondary | ICD-10-CM | POA: Diagnosis not present

## 2021-02-08 DIAGNOSIS — K219 Gastro-esophageal reflux disease without esophagitis: Secondary | ICD-10-CM | POA: Diagnosis not present

## 2021-02-15 DIAGNOSIS — K929 Disease of digestive system, unspecified: Secondary | ICD-10-CM | POA: Diagnosis not present

## 2021-02-15 DIAGNOSIS — Z6834 Body mass index (BMI) 34.0-34.9, adult: Secondary | ICD-10-CM | POA: Diagnosis not present

## 2021-03-06 DIAGNOSIS — E7800 Pure hypercholesterolemia, unspecified: Secondary | ICD-10-CM | POA: Diagnosis not present

## 2021-03-06 DIAGNOSIS — K219 Gastro-esophageal reflux disease without esophagitis: Secondary | ICD-10-CM | POA: Diagnosis not present

## 2021-03-06 DIAGNOSIS — I10 Essential (primary) hypertension: Secondary | ICD-10-CM | POA: Diagnosis not present

## 2021-04-05 NOTE — Progress Notes (Signed)
?Charlann Boxer D.O. ?[facility] ?[place] ?Phone: [phone] ?Subjective:   ?I, [name], am serving as a scribe for Dr. Hulan Saas. ? ?This visit occurred during the SARS-CoV-2 public health emergency.  Safety protocols were in place, including screening questions prior to the visit, additional usage of staff PPE, and extensive cleaning of exam room while observing appropriate contact time as indicated for disinfecting solutions.  ?I'm seeing this patient by the request  of:  Lavone Orn, MD ? ?CC: Bilateral knee pain follow-up ? ?[id number]  ?01/31/2021 ?Patient did have aspiration done today.  Tolerated the procedure well.  Discussed with patient about the other possibility of PRP.  We will see how patient responds to the steroid and he can repeat these every 10 weeks if necessary as well.  Patient otherwise would need to consider the possibility of surgical intervention with replacement.  Follow-up with me again in 10 weeks ? ?Update 04/11/2021 ?Terry Roy is a 71 y.o. male coming in with complaint of B knee pain. Patient states that Baker's Cyst is back in R. R>L. Draining did not help pain and he does not feel like it is necessary to try again. Pain worse with working out such as squats. Pain with walking has improved after aspiration last visit.  ? ?  ? ?Past Medical History:  ?Diagnosis Date  ? Arthritis   ? Bilateral carpal tunnel syndrome 12/01/2019  ? Dilated aortic root ([facility])   ? 90m by echo 02/2017  ? GERD (gastroesophageal reflux disease)   ? IBS (irritable bowel syndrome)   ? Mesenteric mass   ? benign fat necrosis and fibrosis  ? Stroke ([facility] 02/2019  ? TIA  ? Wears glasses   ? ?Past Surgical History:  ?Procedure Laterality Date  ? HERNIA REPAIR    ? ventral hernia   ? KNEE ARTHROSCOPY Bilateral   ? MASS EXCISION    ? mesenteric mass resection   ? TUMOR REMOVAL  February 2010  ? benign   ? ?Social History  ? ?Socioeconomic History  ? Marital  status: Married  ?  Spouse name: Not on file  ? Number of children: Not on file  ? Years of education: Not on file  ? Highest education level: Not on file  ?Occupational History  ? Occupation: Retired  ?Tobacco Use  ? Smoking status: Never  ? Smokeless tobacco: Never  ?Substance and Sexual Activity  ? Alcohol use: Yes  ?  Comment: socially  ? Drug use: No  ? Sexual activity: Not on file  ?Other Topics Concern  ? Not on file  ?Social History Narrative  ? Lives with wife  ? Right Handed  ? Drinks 1-2 cups caffeine daily  ? ?Social Determinants of Health  ? ?Financial Resource Strain: Not on file  ?Food Insecurity: Not on file  ?Transportation Needs: Not on file  ?Physical Activity: Not on file  ?Stress: Not on file  ?Social Connections: Not on file  ? ?Allergies  ?Allergen Reactions  ? Amlodipine Besylate   ?  Other reaction(s): palpitations  ? Atorvastatin   ?  Other reaction(s): leg weakness  ? Pravastatin Sodium   ?  Other reaction(s): myalgias, stomach upset  ? Rosuvastatin   ?  Other reaction(s): nausea, myalgias  ? ?Family History  ?Problem Relation Age of Onset  ? Osteoporosis Mother   ? Heart disease Father   ? Cancer Father   ?     Leukemia  ?  Bullous pemphigoid Sister   ? ? ? ?Current Outpatient Medications (Cardiovascular):  ?  Evolocumab (REPATHA SURECLICK) 081 MG/ML SOAJ, Inject into the skin. ? ? ?Current Outpatient Medications (Analgesics):  ?  aspirin EC 81 MG tablet, Take 1 tablet (81 mg total) by mouth daily. Swallow whole. ? ? ?Current Outpatient Medications (Other):  ?  escitalopram (LEXAPRO) 10 MG tablet, Take 10 mg by mouth 2 (two) times daily.  ?  fish oil-omega-3 fatty acids 1000 MG capsule, Take 2 g by mouth daily. ?  Glucosamine-Chondroitin (GLUCOSAMINE CHONDR COMPLEX PO), Take 2,400-3,000 mg by mouth 2 (two) times daily. ?  mirtazapine (REMERON) 30 MG tablet, Take 30 mg by mouth at bedtime.  ?  omeprazole (PRILOSEC) 20 MG capsule, Take 20 mg by mouth 2 (two) times daily before a meal. ?   pregabalin (LYRICA) 75 MG capsule,  ? ? ?Reviewed prior external information including notes and imaging from  ?primary care provider ?As well as notes that were available from care everywhere and other healthcare systems. ? ?Past medical history, social, surgical and family history all reviewed in electronic medical record.  No pertanent information unless stated regarding to the chief complaint.  ? ?Review of Systems: ? No headache, visual changes, nausea, vomiting, diarrhea, constipation, dizziness, abdominal pain, skin rash, fevers, chills, night sweats, weight loss, swollen lymph nodes, body aches, j chest pain, shortness of breath, mood changes. POSITIVE muscle aches, joint swelling ? ?Objective  ?Blood pressure 136/80, pulse 81, height '5\' 8"'$  (1.727 m), weight 228 lb (103.4 kg), SpO2 96 %. ?  ?General: No apparent distress alert and oriented x3 mood and affect normal, dressed appropriately.  ?HEENT: Pupils equal, extraocular movements intact  ?Respiratory: Patient's speak in full sentences and does not appear short of breath  ?Cardiovascular: No lower extremity edema, non tender, no erythema  ?Gait normal with good balance and coordination.  ?MSK: Patient's knee exam shows the patient does have arthritic changes bilaterally.  Crepitus noted.  Instability with valgus and varus force.  Baker's cyst palpated bilaterally but severely large on the right side. ? ?  ?Impression and Recommendations:  ?  ? ?The above documentation has been reviewed and is accurate and complete Lyndal Pulley, DO ? ? ? ?

## 2021-04-06 ENCOUNTER — Other Ambulatory Visit: Payer: Self-pay

## 2021-04-06 ENCOUNTER — Encounter: Payer: Self-pay | Admitting: Podiatry

## 2021-04-06 ENCOUNTER — Ambulatory Visit: Payer: PPO | Admitting: Podiatry

## 2021-04-06 DIAGNOSIS — M7752 Other enthesopathy of left foot: Secondary | ICD-10-CM | POA: Diagnosis not present

## 2021-04-06 DIAGNOSIS — M21622 Bunionette of left foot: Secondary | ICD-10-CM | POA: Diagnosis not present

## 2021-04-06 DIAGNOSIS — M7751 Other enthesopathy of right foot: Secondary | ICD-10-CM | POA: Diagnosis not present

## 2021-04-06 DIAGNOSIS — M21621 Bunionette of right foot: Secondary | ICD-10-CM | POA: Diagnosis not present

## 2021-04-06 MED ORDER — DEXAMETHASONE SODIUM PHOSPHATE 120 MG/30ML IJ SOLN
4.0000 mg | Freq: Once | INTRAMUSCULAR | Status: AC
  Administered 2021-04-06: 4 mg via INTRA_ARTICULAR

## 2021-04-06 NOTE — Progress Notes (Signed)
He presents today for follow-up of his fifth metatarsal pain in the right foot.  He states that is the same issue again.  He states now the left foot starting to hurt. ? ?Objective: Vital signs are stable he is alert and oriented x3 tailors bunion deformities resulting in capsulitis and bursitis of the plantar and plantar lateral aspect of the fifth metatarsal heads bilaterally.  Pulses remain palpable and strong no open lesions or wounds. ? ?Assessment: It is bunion deformities bilateral with bursitis bilateral. ? ?Plan: I injected these today with dexamethasone and local anesthetic he tolerated the procedure well without complications.  We did discuss possible need for surgical intervention as well as wider shoe gear. ?

## 2021-04-11 ENCOUNTER — Other Ambulatory Visit: Payer: Self-pay

## 2021-04-11 ENCOUNTER — Encounter: Payer: Self-pay | Admitting: Family Medicine

## 2021-04-11 ENCOUNTER — Ambulatory Visit: Payer: PPO | Admitting: Family Medicine

## 2021-04-11 DIAGNOSIS — M170 Bilateral primary osteoarthritis of knee: Secondary | ICD-10-CM

## 2021-04-11 NOTE — Assessment & Plan Note (Addendum)
Patient does have severe arthritic changes bilaterally.  Discussed icing regimen and home exercises, discussed which activities to do and which ones to avoid.  Increase activity slowly.  Follow-up with me again in 6 to 8 weeks.  At that point can repeat injections patient still wants to avoid any surgical intervention.  Discussed that at some point there is likely a possibility he will need to.  Encouraged weight loss at this time. ?

## 2021-04-11 NOTE — Patient Instructions (Signed)
Keep doing as much as it will allow you ?Repeat injections in 5 weeks  ?

## 2021-04-19 DIAGNOSIS — L820 Inflamed seborrheic keratosis: Secondary | ICD-10-CM | POA: Diagnosis not present

## 2021-04-19 DIAGNOSIS — B078 Other viral warts: Secondary | ICD-10-CM | POA: Diagnosis not present

## 2021-05-12 ENCOUNTER — Ambulatory Visit
Admission: RE | Admit: 2021-05-12 | Discharge: 2021-05-12 | Disposition: A | Discharge status: Home / Self Care | Payer: PPO | Source: Ambulatory Visit | Attending: Internal Medicine | Admitting: Internal Medicine

## 2021-05-12 ENCOUNTER — Other Ambulatory Visit: Payer: Self-pay | Admitting: Internal Medicine

## 2021-05-12 DIAGNOSIS — R5383 Other fatigue: Secondary | ICD-10-CM | POA: Diagnosis not present

## 2021-05-12 DIAGNOSIS — R0609 Other forms of dyspnea: Secondary | ICD-10-CM | POA: Diagnosis not present

## 2021-05-12 DIAGNOSIS — M79661 Pain in right lower leg: Secondary | ICD-10-CM | POA: Diagnosis not present

## 2021-05-12 DIAGNOSIS — M79662 Pain in left lower leg: Secondary | ICD-10-CM | POA: Diagnosis not present

## 2021-05-12 NOTE — Progress Notes (Signed)
?Charlann Boxer D.O. ?[facility] ?[place] ?Phone: [phone] ?Subjective:   ?I, Judy Pimple, am serving as a scribe for Dr. Hulan Saas. ? ?I'm seeing this patient by the request  of:  Lavone Orn, MD ? ?CC: Bilateral knee pain ? ?[id number]  ?04/11/2021 ?Patient does have severe arthritic changes bilaterally.  Discussed icing regimen and home exercises, discussed which activities to do and which ones to avoid.  Increase activity slowly.  Follow-up with me again in 6 to 8 weeks.  At that point can repeat injections patient still wants to avoid any surgical intervention.  Discussed that at some point there is likely a possibility he will need to.  Encouraged weight loss at this time. ? ?Updated 05/15/2021 ?Terry Roy is a 71 y.o. male coming in with complaint of bilateral knee pain. Patient states the knees feels about the same and right knee is still worse than the left.  No instability but he is concerned that it is still giving him pain.  Patient wants to avoid surgery if possible. ? ? ? ?  ? ?Past Medical History:  ?Diagnosis Date  ? Arthritis   ? Bilateral carpal tunnel syndrome 12/01/2019  ? Dilated aortic root ([place])   ? 42m by echo 02/2017  ? GERD (gastroesophageal reflux disease)   ? IBS (irritable bowel syndrome)   ? Mesenteric mass   ? benign fat necrosis and fibrosis  ? Stroke ([facility] 02/2019  ? TIA  ? Wears glasses   ? ?Past Surgical History:  ?Procedure Laterality Date  ? HERNIA REPAIR    ? ventral hernia   ? KNEE ARTHROSCOPY Bilateral   ? MASS EXCISION    ? mesenteric mass resection   ? TUMOR REMOVAL  February 2010  ? benign   ? ?Social History  ? ?Socioeconomic History  ? Marital status: Married  ?  Spouse name: Not on file  ? Number of children: Not on file  ? Years of education: Not on file  ? Highest education level: Not on file  ?Occupational History  ? Occupation: Retired  ?Tobacco Use  ? Smoking status: Never  ? Smokeless tobacco: Never   ?Substance and Sexual Activity  ? Alcohol use: Yes  ?  Comment: socially  ? Drug use: No  ? Sexual activity: Not on file  ?Other Topics Concern  ? Not on file  ?Social History Narrative  ? Lives with wife  ? Right Handed  ? Drinks 1-2 cups caffeine daily  ? ?Social Determinants of Health  ? ?Financial Resource Strain: Not on file  ?Food Insecurity: Not on file  ?Transportation Needs: Not on file  ?Physical Activity: Not on file  ?Stress: Not on file  ?Social Connections: Not on file  ? ?Allergies  ?Allergen Reactions  ? Amlodipine Besylate   ?  Other reaction(s): palpitations  ? Atorvastatin   ?  Other reaction(s): leg weakness  ? Pravastatin Sodium   ?  Other reaction(s): myalgias, stomach upset  ? Rosuvastatin   ?  Other reaction(s): nausea, myalgias  ? ?Family History  ?Problem Relation Age of Onset  ? Osteoporosis Mother   ? Heart disease Father   ? Cancer Father   ?     Leukemia  ? Bullous pemphigoid Sister   ? ? ? ?Current Outpatient Medications (Cardiovascular):  ?  Evolocumab (REPATHA SURECLICK) 1175MG/ML SOAJ, Inject into the skin. ? ? ?Current Outpatient Medications (Analgesics):  ?  aspirin EC 81 MG  tablet, Take 1 tablet (81 mg total) by mouth daily. Swallow whole. ? ? ?Current Outpatient Medications (Other):  ?  escitalopram (LEXAPRO) 10 MG tablet, Take 10 mg by mouth 2 (two) times daily.  ?  fish oil-omega-3 fatty acids 1000 MG capsule, Take 2 g by mouth daily. ?  Glucosamine-Chondroitin (GLUCOSAMINE CHONDR COMPLEX PO), Take 2,400-3,000 mg by mouth 2 (two) times daily. ?  mirtazapine (REMERON) 30 MG tablet, Take 30 mg by mouth at bedtime.  ?  omeprazole (PRILOSEC) 20 MG capsule, Take 20 mg by mouth 2 (two) times daily before a meal. ? ? ?Reviewed prior external information including notes and imaging from  ?primary care provider ?As well as notes that were available from care everywhere and other healthcare systems. ? ?Past medical history, social, surgical and family history all reviewed in electronic  medical record.  No pertanent information unless stated regarding to the chief complaint.  ? ?Review of Systems: ? No headache, visual changes, nausea, vomiting, diarrhea, constipation, dizziness, abdominal pain, skin rash, fevers, chills, night sweats, weight loss, swollen lymph nodes, body aches,  chest pain, shortness of breath, mood changes. POSITIVE muscle aches, joint swelling ? ?Objective  ?Blood pressure 132/70, pulse 71, height '5\' 8"'$  (1.727 m), weight 229 lb (103.9 kg), SpO2 98 %. ?  ?General: No apparent distress alert and oriented x3 mood and affect normal, dressed appropriately.  ?HEENT: Pupils equal, extraocular movements intact  ?Respiratory: Patient's speak in full sentences and does not appear short of breath  ?Cardiovascular: No lower extremity edema, non tender, no erythema  ?Gait normal with good balance and coordination.  ?MSK: Patient's knees do have significant strength of the VMO is noted.  Patient does have arthritic changes noted.  Crepitus noted.  Fullness noted in the popliteal area right greater than left.  Lacks last 5 degrees of flexion and extension right greater than left. ? ?After informed written and verbal consent, patient was seated on exam table. Right knee was prepped with alcohol swab and utilizing anterolateral approach, patient's right knee space was injected with 4:1  marcaine 0.5%: Kenalog '40mg'$ /dL. Patient tolerated the procedure well without immediate complications. ? ?After informed written and verbal consent, patient was seated on exam table. Left knee was prepped with alcohol swab and utilizing anterolateral approach, patient's left knee space was injected with 4:1  marcaine 0.5%: Kenalog '40mg'$ /dL. Patient tolerated the procedure well without immediate complications. ?  ?Impression and Recommendations:  ?  ? ?The above documentation has been reviewed and is accurate and complete Lyndal Pulley, DO ? ? ? ?

## 2021-05-15 ENCOUNTER — Encounter: Payer: Self-pay | Admitting: Family Medicine

## 2021-05-15 ENCOUNTER — Ambulatory Visit: Payer: PPO | Admitting: Family Medicine

## 2021-05-15 DIAGNOSIS — M170 Bilateral primary osteoarthritis of knee: Secondary | ICD-10-CM | POA: Diagnosis not present

## 2021-05-15 NOTE — Patient Instructions (Addendum)
Good to see you  ?Injection in both knees today ?consider the PRP ?Otherwise can repeat steroid every three months  ?Follow up in 10-12 weeks or sooner if wanting to do the PRP ?

## 2021-05-15 NOTE — Assessment & Plan Note (Signed)
Repeat injection given today, tolerated the procedure well, discussed icing regimen and home exercise, which activities to do which ones to avoid, patient does have a very severe 3 compartment osteoarthritic changes.  Patient will increase activity as much as possible we discussed once again about PRP.  Follow-up again in 10 to 12 weeks ?

## 2021-05-31 DIAGNOSIS — M79662 Pain in left lower leg: Secondary | ICD-10-CM | POA: Diagnosis not present

## 2021-05-31 DIAGNOSIS — R269 Unspecified abnormalities of gait and mobility: Secondary | ICD-10-CM | POA: Diagnosis not present

## 2021-05-31 DIAGNOSIS — M79605 Pain in left leg: Secondary | ICD-10-CM | POA: Diagnosis not present

## 2021-07-21 NOTE — Progress Notes (Signed)
Terry Roy [place] [address] [place] Phone: [phone] Subjective:   Terry Roy, am serving as a scribe for Dr. Hulan Saas.  I'm seeing this patient by the request  of:  Terry Orn, MD  CC: right knee pain   [id number]  05/15/2021 Repeat injection given today, tolerated the procedure well, discussed icing regimen and home exercise, which activities to do which ones to avoid, patient does have a very severe 3 compartment osteoarthritic changes.  Patient will increase activity as much as possible we discussed once again about PRP.  Follow-up again in 10 to 12 weeks  Updated 08/01/2021 Terry Roy is a 71 y.o. male coming in with complaint of right knee pain. Injection worked well last time. Ready for another one today for both knees. No new complaints.       Past Medical History:  Diagnosis Date   Arthritis    Bilateral carpal tunnel syndrome 12/01/2019   Dilated aortic root ([facility])    73m by echo 02/2017   GERD (gastroesophageal reflux disease)    IBS (irritable bowel syndrome)    Mesenteric mass    benign fat necrosis and fibrosis   [place]   TIA   Wears glasses    Past Surgical History:  Procedure Laterality Date   HERNIA REPAIR     ventral hernia    KNEE ARTHROSCOPY Bilateral    MASS EXCISION     mesenteric mass resection    TUMOR REMOVAL  February 2010   benign    Social History   Socioeconomic History   Marital status: Married    Spouse name: Not on file   Number of children: Not on file   Years of education: Not on file   Highest education level: Not on file  Occupational History   Occupation: Retired  Tobacco Use   Smoking status: Never   Smokeless tobacco: Never  Substance and Sexual Activity   Alcohol use: Yes    Comment: socially   Drug use: No   Sexual activity: Not on file  Other Topics Concern   Not on file  Social History Narrative   Lives with wife   Right Handed    Drinks 1-2 cups caffeine daily   Social Determinants of Health   Financial Resource Strain: Not on file  Food Insecurity: Not on file  Transportation Needs: Not on file  Physical Activity: Not on file  Stress: Not on file  Social Connections: Not on file   Allergies  Allergen Reactions   Amlodipine Besylate     Other reaction(s): palpitations   Atorvastatin     Other reaction(s): leg weakness   Pravastatin Sodium     Other reaction(s): myalgias, stomach upset   Rosuvastatin     Other reaction(s): nausea, myalgias   Family History  Problem Relation Age of Onset   Osteoporosis Mother    Heart disease Father    Cancer Father        Leukemia   Bullous pemphigoid Sister      Current Outpatient Medications (Cardiovascular):    Evolocumab (REPATHA SURECLICK) 1381MG/ML SOAJ, Inject into the skin.   Current Outpatient Medications (Analgesics):    aspirin EC 81 MG tablet, Take 1 tablet (81 mg total) by mouth daily. Swallow whole.   Current Outpatient Medications (Other):    escitalopram (LEXAPRO) 10 MG tablet, Take 10 mg by mouth 2 (two) times daily.    fish oil-omega-3 fatty acids  1000 MG capsule, Take 2 g by mouth daily.   Glucosamine-Chondroitin (GLUCOSAMINE CHONDR COMPLEX PO), Take 2,400-3,000 mg by mouth 2 (two) times daily.   mirtazapine (REMERON) 30 MG tablet, Take 30 mg by mouth at bedtime.    omeprazole (PRILOSEC) 20 MG capsule, Take 20 mg by mouth 2 (two) times daily before a meal.   Reviewed prior external information including notes and imaging from  primary care provider As well as notes that were available from care everywhere and other healthcare systems.  Past medical history, social, surgical and family history all reviewed in electronic medical record.  No pertanent information unless stated regarding to the chief complaint.   Review of Systems:  No headache, visual changes, nausea, vomiting, diarrhea, constipation, dizziness, abdominal pain, skin  rash, fevers, chills, night sweats, weight loss, swollen lymph nodes, body aches, joint swelling, chest pain, shortness of breath, mood changes. POSITIVE muscle aches  Objective  Blood pressure 122/64, pulse 73, height '5\' 8"'$  (1.727 m), weight 228 lb (103.4 kg), SpO2 94 %.   General: No apparent distress alert and oriented x3 mood and affect normal, dressed appropriately.  HEENT: Pupils equal, extraocular movements intact  Respiratory: Patient's speak in full sentences and does not appear short of breath  Cardiovascular: No lower extremity edema, non tender, no erythema  Patient does have a trace effusion of the knees bilaterally.  Patient does have some crepitus noted.  Instability noted with valgus and varus force. Ambulates with mild antalgic gait  After informed written and verbal consent, patient was seated on exam table. Right knee was prepped with alcohol swab and utilizing anterolateral approach, patient's right knee space was injected with 4:1  marcaine 0.5%: Kenalog '40mg'$ /dL. Patient tolerated the procedure well without immediate complications.  After informed written and verbal consent, patient was seated on exam table. Left knee was prepped with alcohol swab and utilizing anterolateral approach, patient's left knee space was injected with 4:1  marcaine 0.5%: Kenalog '40mg'$ /dL. Patient tolerated the procedure well without immediate complications.    Impression and Recommendations:      The above documentation has been reviewed and is accurate and complete Lyndal Pulley, DO

## 2021-08-01 ENCOUNTER — Ambulatory Visit (INDEPENDENT_AMBULATORY_CARE_PROVIDER_SITE_OTHER): Payer: PPO | Admitting: Family Medicine

## 2021-08-01 DIAGNOSIS — M170 Bilateral primary osteoarthritis of knee: Secondary | ICD-10-CM

## 2021-08-01 NOTE — Patient Instructions (Addendum)
Good to see you! Work on [employer] for [employer] Injection in both knees today See you again in 10-12 weeks

## 2021-08-01 NOTE — Assessment & Plan Note (Signed)
Chronic problem with worsening symptoms again.  Patient continues to lift heavy feels like this does help him with many other of his health problems but unfortunately does seem to make the knees worse.  Discussed with patient about icing regimen and home exercises, which activities to do and which ones to avoid.  Increase activity slowly otherwise.  We discussed again about the possibility of PRP.  Follow-up with me again 10 weeks otherwise

## 2021-08-14 ENCOUNTER — Encounter: Payer: Self-pay | Admitting: Neurology

## 2021-08-14 ENCOUNTER — Ambulatory Visit: Payer: PPO | Admitting: Neurology

## 2021-08-14 VITALS — BP 126/76 | HR 75 | Ht 68.0 in | Wt 227.4 lb

## 2021-08-14 DIAGNOSIS — R200 Anesthesia of skin: Secondary | ICD-10-CM | POA: Diagnosis not present

## 2021-08-14 DIAGNOSIS — Z8673 Personal history of transient ischemic attack (TIA), and cerebral infarction without residual deficits: Secondary | ICD-10-CM | POA: Diagnosis not present
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
## 2021-08-14 NOTE — Patient Instructions (Signed)
I had a long discussion with the patient regarding his remote stroke and he seems to be doing well from neurovascular standpoint without recurrent TIA or stroke symptoms.  Continue aspirin for stroke prevention and maintain aggressive risk factor modification strict control of hypertension with blood pressure goal below 140/90, lipids with LDL cholesterol goal below 70 mg percent and diabetes with hemoglobin A1c goal below 6.5%.  His symptoms of carpal tunnel in the hand and sciatica has also seem to be quiescent at the present time.  Recommend he avoid activities which involve p rapid repetitive hand and wrist flexion movements and use a splint if carpal tunnel symptoms recur.  No schedule follow-up appointment with me is necessary but he may return in the future as needed only.

## 2021-08-16 DIAGNOSIS — R110 Nausea: Secondary | ICD-10-CM | POA: Diagnosis not present

## 2021-08-16 DIAGNOSIS — Z6834 Body mass index (BMI) 34.0-34.9, adult: Secondary | ICD-10-CM | POA: Diagnosis not present

## 2021-08-16 DIAGNOSIS — K929 Disease of digestive system, unspecified: Secondary | ICD-10-CM | POA: Diagnosis not present

## 2021-08-23 ENCOUNTER — Other Ambulatory Visit: Payer: Self-pay | Admitting: Internal Medicine

## 2021-08-23 ENCOUNTER — Ambulatory Visit
Admission: RE | Admit: 2021-08-23 | Discharge: 2021-08-23 | Disposition: A | Discharge status: Home / Self Care | Payer: PPO | Source: Ambulatory Visit | Attending: Internal Medicine | Admitting: Internal Medicine

## 2021-08-23 DIAGNOSIS — M542 Cervicalgia: Secondary | ICD-10-CM

## 2021-08-23 DIAGNOSIS — H539 Unspecified visual disturbance: Secondary | ICD-10-CM | POA: Diagnosis not present

## 2021-08-23 DIAGNOSIS — R519 Headache, unspecified: Secondary | ICD-10-CM | POA: Diagnosis not present

## 2021-10-02 DIAGNOSIS — M25651 Stiffness of right hip, not elsewhere classified: Secondary | ICD-10-CM | POA: Diagnosis not present

## 2021-10-02 DIAGNOSIS — R293 Abnormal posture: Secondary | ICD-10-CM | POA: Diagnosis not present

## 2021-10-02 DIAGNOSIS — M5451 Vertebrogenic low back pain: Secondary | ICD-10-CM | POA: Diagnosis not present

## 2021-10-02 DIAGNOSIS — M625A2 Muscle wasting and atrophy, not elsewhere classified, back, lumbosacral: Secondary | ICD-10-CM | POA: Diagnosis not present
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
## 2021-10-06 DIAGNOSIS — M625A2 Muscle wasting and atrophy, not elsewhere classified, back, lumbosacral: Secondary | ICD-10-CM | POA: Diagnosis not present

## 2021-10-06 DIAGNOSIS — R293 Abnormal posture: Secondary | ICD-10-CM | POA: Diagnosis not present

## 2021-10-06 DIAGNOSIS — M25651 Stiffness of right hip, not elsewhere classified: Secondary | ICD-10-CM | POA: Diagnosis not present

## 2021-10-06 DIAGNOSIS — M5451 Vertebrogenic low back pain: Secondary | ICD-10-CM | POA: Diagnosis not present

## 2021-10-09 DIAGNOSIS — M5451 Vertebrogenic low back pain: Secondary | ICD-10-CM | POA: Diagnosis not present

## 2021-10-09 DIAGNOSIS — R293 Abnormal posture: Secondary | ICD-10-CM | POA: Diagnosis not present

## 2021-10-09 DIAGNOSIS — M625A2 Muscle wasting and atrophy, not elsewhere classified, back, lumbosacral: Secondary | ICD-10-CM | POA: Diagnosis not present

## 2021-10-09 DIAGNOSIS — M25651 Stiffness of right hip, not elsewhere classified: Secondary | ICD-10-CM | POA: Diagnosis not present

## 2021-10-10 ENCOUNTER — Encounter: Payer: Self-pay | Admitting: Family Medicine

## 2021-10-10 ENCOUNTER — Ambulatory Visit: Payer: PPO | Admitting: Family Medicine

## 2021-10-10 ENCOUNTER — Ambulatory Visit (INDEPENDENT_AMBULATORY_CARE_PROVIDER_SITE_OTHER): Payer: PPO

## 2021-10-10 ENCOUNTER — Other Ambulatory Visit: Payer: Self-pay

## 2021-10-10 VITALS — BP 142/70 | HR 88 | Ht 68.0 in | Wt 229.0 lb

## 2021-10-10 DIAGNOSIS — M545 Low back pain, unspecified: Secondary | ICD-10-CM | POA: Diagnosis not present

## 2021-10-10 DIAGNOSIS — G8929 Other chronic pain: Secondary | ICD-10-CM

## 2021-10-10 DIAGNOSIS — M5442 Lumbago with sciatica, left side: Secondary | ICD-10-CM | POA: Diagnosis not present

## 2021-10-10 DIAGNOSIS — M170 Bilateral primary osteoarthritis of knee: Secondary | ICD-10-CM

## 2021-10-10 DIAGNOSIS — M549 Dorsalgia, unspecified: Secondary | ICD-10-CM

## 2021-10-10 MED ORDER — PREDNISONE 20 MG PO TABS: 40.0000 mg | ORAL_TABLET | Freq: Every day | ORAL | 0 refills | Status: DC
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
## 2021-10-10 NOTE — Assessment & Plan Note (Signed)
We will repeat bilateral injections given again today.  Still not responding to these injections.  Chronic problem with worsening symptoms.  Still holding out for any type of surgical intervention.  Patient's BMI is under 35 though some could do it at any point.  Follow-up with me again in 6 to 8 weeks otherwise.

## 2021-10-10 NOTE — Patient Instructions (Signed)
Good to see you  Need lumbar xray on the way out  Injection in the knees today  Prednisone 40 mg daily for 5 days See me again in 10 weeks

## 2021-10-11 DIAGNOSIS — M625A2 Muscle wasting and atrophy, not elsewhere classified, back, lumbosacral: Secondary | ICD-10-CM | POA: Diagnosis not present

## 2021-10-11 DIAGNOSIS — M5451 Vertebrogenic low back pain: Secondary | ICD-10-CM | POA: Diagnosis not present

## 2021-10-11 DIAGNOSIS — R293 Abnormal posture: Secondary | ICD-10-CM | POA: Diagnosis not present

## 2021-10-11 DIAGNOSIS — M25651 Stiffness of right hip, not elsewhere classified: Secondary | ICD-10-CM | POA: Diagnosis not present

## 2021-10-16 DIAGNOSIS — R293 Abnormal posture: Secondary | ICD-10-CM | POA: Diagnosis not present

## 2021-10-16 DIAGNOSIS — M625A2 Muscle wasting and atrophy, not elsewhere classified, back, lumbosacral: Secondary | ICD-10-CM | POA: Diagnosis not present

## 2021-10-16 DIAGNOSIS — M25651 Stiffness of right hip, not elsewhere classified: Secondary | ICD-10-CM | POA: Diagnosis not present

## 2021-10-16 DIAGNOSIS — M5451 Vertebrogenic low back pain: Secondary | ICD-10-CM | POA: Diagnosis not present

## 2021-10-18 DIAGNOSIS — D485 Neoplasm of uncertain behavior of skin: Secondary | ICD-10-CM | POA: Diagnosis not present

## 2021-10-18 DIAGNOSIS — D224 Melanocytic nevi of scalp and neck: Secondary | ICD-10-CM | POA: Diagnosis not present

## 2021-10-18 DIAGNOSIS — L821 Other seborrheic keratosis: Secondary | ICD-10-CM | POA: Diagnosis not present

## 2021-10-18 DIAGNOSIS — C44319 Basal cell carcinoma of skin of other parts of face: Secondary | ICD-10-CM | POA: Diagnosis not present

## 2021-10-18 DIAGNOSIS — D225 Melanocytic nevi of trunk: Secondary | ICD-10-CM | POA: Diagnosis not present

## 2021-10-18 DIAGNOSIS — C44519 Basal cell carcinoma of skin of other part of trunk: Secondary | ICD-10-CM | POA: Diagnosis not present

## 2021-10-19 DIAGNOSIS — H16201 Unspecified keratoconjunctivitis, right eye: Secondary | ICD-10-CM | POA: Diagnosis not present

## 2021-10-19 DIAGNOSIS — H04211 Epiphora due to excess lacrimation, right lacrimal gland: Secondary | ICD-10-CM | POA: Diagnosis not present

## 2021-10-25 DIAGNOSIS — M5451 Vertebrogenic low back pain: Secondary | ICD-10-CM | POA: Diagnosis not present

## 2021-10-25 DIAGNOSIS — M25651 Stiffness of right hip, not elsewhere classified: Secondary | ICD-10-CM | POA: Diagnosis not present

## 2021-10-25 DIAGNOSIS — M625A2 Muscle wasting and atrophy, not elsewhere classified, back, lumbosacral: Secondary | ICD-10-CM | POA: Diagnosis not present

## 2021-10-25 DIAGNOSIS — R293 Abnormal posture: Secondary | ICD-10-CM | POA: Diagnosis not present

## 2021-10-27 DIAGNOSIS — M5451 Vertebrogenic low back pain: Secondary | ICD-10-CM | POA: Diagnosis not present

## 2021-10-27 DIAGNOSIS — M25651 Stiffness of right hip, not elsewhere classified: Secondary | ICD-10-CM | POA: Diagnosis not present

## 2021-10-27 DIAGNOSIS — R293 Abnormal posture: Secondary | ICD-10-CM | POA: Diagnosis not present

## 2021-10-27 DIAGNOSIS — M625A2 Muscle wasting and atrophy, not elsewhere classified, back, lumbosacral: Secondary | ICD-10-CM | POA: Diagnosis not present

## 2021-11-01 DIAGNOSIS — D224 Melanocytic nevi of scalp and neck: Secondary | ICD-10-CM | POA: Diagnosis not present

## 2021-11-01 DIAGNOSIS — D485 Neoplasm of uncertain behavior of skin: Secondary | ICD-10-CM | POA: Diagnosis not present

## 2021-11-03 DIAGNOSIS — M5451 Vertebrogenic low back pain: Secondary | ICD-10-CM | POA: Diagnosis not present

## 2021-11-03 DIAGNOSIS — M25651 Stiffness of right hip, not elsewhere classified: Secondary | ICD-10-CM | POA: Diagnosis not present

## 2021-11-03 DIAGNOSIS — M625A2 Muscle wasting and atrophy, not elsewhere classified, back, lumbosacral: Secondary | ICD-10-CM | POA: Diagnosis not present

## 2021-11-03 DIAGNOSIS — R293 Abnormal posture: Secondary | ICD-10-CM | POA: Diagnosis not present

## 2021-11-17 DIAGNOSIS — M5451 Vertebrogenic low back pain: Secondary | ICD-10-CM | POA: Diagnosis not present

## 2021-11-17 DIAGNOSIS — R293 Abnormal posture: Secondary | ICD-10-CM | POA: Diagnosis not present

## 2021-11-17 DIAGNOSIS — M25651 Stiffness of right hip, not elsewhere classified: Secondary | ICD-10-CM | POA: Diagnosis not present

## 2021-11-17 DIAGNOSIS — M625A2 Muscle wasting and atrophy, not elsewhere classified, back, lumbosacral: Secondary | ICD-10-CM | POA: Diagnosis not present

## 2021-11-20 DIAGNOSIS — M5451 Vertebrogenic low back pain: Secondary | ICD-10-CM | POA: Diagnosis not present

## 2021-11-20 DIAGNOSIS — M625A2 Muscle wasting and atrophy, not elsewhere classified, back, lumbosacral: Secondary | ICD-10-CM | POA: Diagnosis not present

## 2021-11-20 DIAGNOSIS — R293 Abnormal posture: Secondary | ICD-10-CM | POA: Diagnosis not present

## 2021-11-20 DIAGNOSIS — M25651 Stiffness of right hip, not elsewhere classified: Secondary | ICD-10-CM | POA: Diagnosis not present

## 2021-11-27 DIAGNOSIS — M5451 Vertebrogenic low back pain: Secondary | ICD-10-CM | POA: Diagnosis not present

## 2021-11-27 DIAGNOSIS — M625A2 Muscle wasting and atrophy, not elsewhere classified, back, lumbosacral: Secondary | ICD-10-CM | POA: Diagnosis not present

## 2021-11-27 DIAGNOSIS — M25651 Stiffness of right hip, not elsewhere classified: Secondary | ICD-10-CM | POA: Diagnosis not present

## 2021-11-27 DIAGNOSIS — R293 Abnormal posture: Secondary | ICD-10-CM | POA: Diagnosis not present

## 2021-11-29 DIAGNOSIS — M625A2 Muscle wasting and atrophy, not elsewhere classified, back, lumbosacral: Secondary | ICD-10-CM | POA: Diagnosis not present

## 2021-11-29 DIAGNOSIS — M25651 Stiffness of right hip, not elsewhere classified: Secondary | ICD-10-CM | POA: Diagnosis not present

## 2021-11-29 DIAGNOSIS — M5451 Vertebrogenic low back pain: Secondary | ICD-10-CM | POA: Diagnosis not present

## 2021-11-29 DIAGNOSIS — R293 Abnormal posture: Secondary | ICD-10-CM | POA: Diagnosis not present

## 2021-12-04 DIAGNOSIS — R293 Abnormal posture: Secondary | ICD-10-CM | POA: Diagnosis not present

## 2021-12-04 DIAGNOSIS — M25651 Stiffness of right hip, not elsewhere classified: Secondary | ICD-10-CM | POA: Diagnosis not present

## 2021-12-04 DIAGNOSIS — M5451 Vertebrogenic low back pain: Secondary | ICD-10-CM | POA: Diagnosis not present

## 2021-12-04 DIAGNOSIS — M625A2 Muscle wasting and atrophy, not elsewhere classified, back, lumbosacral: Secondary | ICD-10-CM | POA: Diagnosis not present

## 2021-12-06 ENCOUNTER — Other Ambulatory Visit: Payer: Self-pay | Admitting: Internal Medicine

## 2021-12-06 DIAGNOSIS — G43A0 Cyclical vomiting, not intractable: Secondary | ICD-10-CM | POA: Diagnosis not present

## 2021-12-06 DIAGNOSIS — R030 Elevated blood-pressure reading, without diagnosis of hypertension: Secondary | ICD-10-CM | POA: Diagnosis not present

## 2021-12-06 DIAGNOSIS — G4733 Obstructive sleep apnea (adult) (pediatric): Secondary | ICD-10-CM | POA: Diagnosis not present

## 2021-12-06 DIAGNOSIS — Z125 Encounter for screening for malignant neoplasm of prostate: Secondary | ICD-10-CM | POA: Diagnosis not present

## 2021-12-06 DIAGNOSIS — Z8673 Personal history of transient ischemic attack (TIA), and cerebral infarction without residual deficits: Secondary | ICD-10-CM | POA: Diagnosis not present

## 2021-12-06 DIAGNOSIS — Z0000 Encounter for general adult medical examination without abnormal findings: Secondary | ICD-10-CM | POA: Diagnosis not present

## 2021-12-06 DIAGNOSIS — Z23 Encounter for immunization: Secondary | ICD-10-CM | POA: Diagnosis not present

## 2021-12-06 DIAGNOSIS — E7800 Pure hypercholesterolemia, unspecified: Secondary | ICD-10-CM | POA: Diagnosis not present

## 2021-12-06 DIAGNOSIS — K219 Gastro-esophageal reflux disease without esophagitis: Secondary | ICD-10-CM | POA: Diagnosis not present

## 2021-12-06 DIAGNOSIS — D201 Benign neoplasm of soft tissue of peritoneum: Secondary | ICD-10-CM | POA: Diagnosis not present

## 2021-12-06 DIAGNOSIS — M5432 Sciatica, left side: Secondary | ICD-10-CM | POA: Diagnosis not present

## 2021-12-06 DIAGNOSIS — Z1331 Encounter for screening for depression: Secondary | ICD-10-CM | POA: Diagnosis not present

## 2021-12-06 DIAGNOSIS — Z5181 Encounter for therapeutic drug level monitoring: Secondary | ICD-10-CM | POA: Diagnosis not present

## 2021-12-06 DIAGNOSIS — M179 Osteoarthritis of knee, unspecified: Secondary | ICD-10-CM | POA: Diagnosis not present

## 2021-12-06 DIAGNOSIS — I700 Atherosclerosis of aorta: Secondary | ICD-10-CM | POA: Diagnosis not present

## 2021-12-08 DIAGNOSIS — M625A2 Muscle wasting and atrophy, not elsewhere classified, back, lumbosacral: Secondary | ICD-10-CM | POA: Diagnosis not present

## 2021-12-08 DIAGNOSIS — M25651 Stiffness of right hip, not elsewhere classified: Secondary | ICD-10-CM | POA: Diagnosis not present

## 2021-12-08 DIAGNOSIS — M5451 Vertebrogenic low back pain: Secondary | ICD-10-CM | POA: Diagnosis not present

## 2021-12-08 DIAGNOSIS — R293 Abnormal posture: Secondary | ICD-10-CM | POA: Diagnosis not present

## 2021-12-11 DIAGNOSIS — M625A2 Muscle wasting and atrophy, not elsewhere classified, back, lumbosacral: Secondary | ICD-10-CM | POA: Diagnosis not present

## 2021-12-11 DIAGNOSIS — M5451 Vertebrogenic low back pain: Secondary | ICD-10-CM | POA: Diagnosis not present

## 2021-12-11 DIAGNOSIS — M25651 Stiffness of right hip, not elsewhere classified: Secondary | ICD-10-CM | POA: Diagnosis not present

## 2021-12-11 DIAGNOSIS — R293 Abnormal posture: Secondary | ICD-10-CM | POA: Diagnosis not present
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
## 2021-12-19 ENCOUNTER — Ambulatory Visit: Payer: PPO | Admitting: Family Medicine

## 2021-12-19 VITALS — BP 132/82 | HR 70 | Ht 68.0 in | Wt 227.0 lb

## 2021-12-19 DIAGNOSIS — M170 Bilateral primary osteoarthritis of knee: Secondary | ICD-10-CM

## 2021-12-19 NOTE — Patient Instructions (Addendum)
Injected both knees today Happy holidays!  Jiffy knee is a good option  See me again though in 10 weeks

## 2021-12-19 NOTE — Assessment & Plan Note (Signed)
Chronic problem with worsening symptoms.  Discussed with patient icing regimen and home exercises.  The patient is doing well with his weight.  We discussed with patient about continue to stay active.  Patient is considering replacement in the near future.  Follow-up with me again in 10 weeks if he wants to continue with the conservative therapy.  Otherwise patient will follow-up with me again as needed.

## 2021-12-20 ENCOUNTER — Encounter: Payer: Self-pay | Admitting: Family Medicine

## 2021-12-20 ENCOUNTER — Ambulatory Visit
Admission: RE | Admit: 2021-12-20 | Discharge: 2021-12-20 | Disposition: A | Discharge status: Home / Self Care | Payer: PPO | Source: Ambulatory Visit | Attending: Internal Medicine | Admitting: Internal Medicine

## 2021-12-20 DIAGNOSIS — M545 Low back pain, unspecified: Secondary | ICD-10-CM | POA: Diagnosis not present

## 2021-12-20 DIAGNOSIS — M48061 Spinal stenosis, lumbar region without neurogenic claudication: Secondary | ICD-10-CM | POA: Diagnosis not present

## 2021-12-20 DIAGNOSIS — M5136 Other intervertebral disc degeneration, lumbar region: Secondary | ICD-10-CM | POA: Diagnosis not present

## 2021-12-20 DIAGNOSIS — M47816 Spondylosis without myelopathy or radiculopathy, lumbar region: Secondary | ICD-10-CM | POA: Diagnosis not present

## 2021-12-20 DIAGNOSIS — M5432 Sciatica, left side: Secondary | ICD-10-CM

## 2021-12-22 DIAGNOSIS — J101 Influenza due to other identified influenza virus with other respiratory manifestations: Secondary | ICD-10-CM | POA: Diagnosis not present

## 2021-12-22 DIAGNOSIS — J069 Acute upper respiratory infection, unspecified: Secondary | ICD-10-CM | POA: Diagnosis not present

## 2021-12-22 DIAGNOSIS — Z03818 Encounter for observation for suspected exposure to other biological agents ruled out: Secondary | ICD-10-CM | POA: Diagnosis not present

## 2021-12-25 ENCOUNTER — Other Ambulatory Visit: Payer: PPO

## 2022-01-05 ENCOUNTER — Ambulatory Visit (INDEPENDENT_AMBULATORY_CARE_PROVIDER_SITE_OTHER): Payer: PPO | Admitting: Orthopaedic Surgery

## 2022-01-05 VITALS — BP 142/81 | HR 84 | Ht 68.0 in | Wt 227.0 lb

## 2022-01-05 DIAGNOSIS — M5126 Other intervertebral disc displacement, lumbar region: Secondary | ICD-10-CM
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
## 2022-02-09 DIAGNOSIS — H52203 Unspecified astigmatism, bilateral: Secondary | ICD-10-CM | POA: Diagnosis not present

## 2022-02-09 DIAGNOSIS — H5213 Myopia, bilateral: Secondary | ICD-10-CM | POA: Diagnosis not present

## 2022-02-09 DIAGNOSIS — H2513 Age-related nuclear cataract, bilateral: Secondary | ICD-10-CM | POA: Diagnosis not present

## 2022-02-20 ENCOUNTER — Ambulatory Visit: Payer: PPO | Admitting: Orthopaedic Surgery

## 2022-02-27 ENCOUNTER — Ambulatory Visit: Payer: PPO | Admitting: Family Medicine

## 2022-02-27 VITALS — BP 142/74 | HR 75 | Ht 68.0 in | Wt 229.0 lb

## 2022-02-27 DIAGNOSIS — M170 Bilateral primary osteoarthritis of knee: Secondary | ICD-10-CM

## 2022-02-27 NOTE — Assessment & Plan Note (Signed)
Chronic problem with worsening symptoms.  Still wants to avoid any type of surgical intervention.  He does like the injections have been doing relatively well though.  Allows him to continue to be active including heavy lifting in the gym.  We discussed with patient that the more low impact exercises could be beneficial.  Follow-up with me again in 10 weeks otherwise.

## 2022-02-27 NOTE — Patient Instructions (Addendum)
Injection in both knees See you again in 10 weeks

## 2022-02-27 NOTE — Progress Notes (Signed)
[facility] [place] [address] [place] Phone: [phone] Subjective:   IVilma Meckel, am serving as a scribe for Dr. Hulan Saas.  I'm seeing this patient by the request  of:  Kathalene Frames, MD  CC: bilateral knee pain   [id number]  12/19/2021 Chronic problem with worsening symptoms. Discussed with patient icing regimen and home exercises. The patient is doing well with his weight. We discussed with patient about continue to stay active. Patient is considering replacement in the near future. Follow-up with me again in 10 weeks if he wants to continue with the conservative therapy. Otherwise patient will follow-up with me again as needed   Updated 02/27/2022 KHALIK PEWITT is a 72 y.o. male coming in with complaint of bilateral knee pain knee pain. Here for injections.       Past Medical History:  Diagnosis Date   Arthritis    Bilateral carpal tunnel syndrome 12/01/2019   Dilated aortic root (HCC)    79m by echo 02/2017   GERD (gastroesophageal reflux disease)    IBS (irritable bowel syndrome)    Mesenteric mass    benign fat necrosis and fibrosis   [place]   TIA   Wears glasses    Past Surgical History:  Procedure Laterality Date   HERNIA REPAIR     ventral hernia    KNEE ARTHROSCOPY Bilateral    MASS EXCISION     mesenteric mass resection    TUMOR REMOVAL  February 2010   benign    Social History   Socioeconomic History   Marital status: Married    Spouse name: Not on file   Number of children: Not on file   Years of education: Not on file   Highest education level: Not on file  Occupational History   Occupation: Retired  Tobacco Use   Smoking status: Never   Smokeless tobacco: Never  Substance and Sexual Activity   Alcohol use: Yes    Comment: socially   Drug use: No   Sexual activity: Not on file  Other Topics Concern   Not on file  Social History Narrative   Lives with wife    Right Handed   Drinks 1-2 cups caffeine daily   Social Determinants of Health   Financial Resource Strain: Not on file  Food Insecurity: Not on file  Transportation Needs: Not on file  Physical Activity: Not on file  Stress: Not on file  Social Connections: Not on file   Allergies  Allergen Reactions   Amlodipine Besylate     Other reaction(s): palpitations   Atorvastatin     Other reaction(s): leg weakness   Pravastatin Sodium     Other reaction(s): myalgias, stomach upset   Rosuvastatin     Other reaction(s): nausea, myalgias   Family History  Problem Relation Age of Onset   Osteoporosis Mother    Heart disease Father    Cancer Father        Leukemia   Bullous pemphigoid Sister     Current Outpatient Medications (Endocrine & Metabolic):    predniSONE (DELTASONE) 20 MG tablet, Take 2 tablets (40 mg total) by mouth daily with breakfast.  Current Outpatient Medications (Cardiovascular):    Evolocumab (REPATHA SURECLICK) 1762MG/ML SOAJ, Inject into the skin.   Current Outpatient Medications (Analgesics):    aspirin EC 81 MG tablet, Take 1 tablet (81 mg total) by mouth daily. Swallow whole.   Current Outpatient Medications (Other):  escitalopram (LEXAPRO) 10 MG tablet, Take 10 mg by mouth 2 (two) times daily.    fish oil-omega-3 fatty acids 1000 MG capsule, Take 2 g by mouth daily.   Glucosamine-Chondroitin (GLUCOSAMINE CHONDR COMPLEX PO), Take 2,400-3,000 mg by mouth 2 (two) times daily.   mirtazapine (REMERON) 30 MG tablet, Take 30 mg by mouth at bedtime.    omeprazole (PRILOSEC) 20 MG capsule, Take 20 mg by mouth 2 (two) times daily before a meal.   Reviewed prior external information including notes and imaging from  primary care provider As well as notes that were available from care everywhere and other healthcare systems.  Past medical history, social, surgical and family history all reviewed in electronic medical record.  No pertanent information unless  stated regarding to the chief complaint.   Review of Systems:  No headache, visual changes, nausea, vomiting, diarrhea, constipation, dizziness, abdominal pain, skin rash, fevers, chills, night sweats, weight loss, swollen lymph nodes, body aches, joint swelling, chest pain, shortness of breath, mood changes. POSITIVE muscle aches  Objective  Blood pressure (!) 142/74, pulse 75, height '5\' 8"'$  (1.727 m), weight 229 lb (103.9 kg), SpO2 95 %.   General: No apparent distress alert and oriented x3 mood and affect normal, dressed appropriately.  HEENT: Pupils equal, extraocular movements intact  Respiratory: Patient's speak in full sentences and does not appear short of breath  Cardiovascular: No lower extremity edema, non tender, no erythema  Knee exam shows the patient does have crepitus noted bilaterally.  Patient does have some instability with valgus and varus force.  Tender to palpation over the medial joint line.  After informed written and verbal consent, patient was seated on exam table. Right knee was prepped with alcohol swab and utilizing anterolateral approach, patient's right knee space was injected with 4:1  marcaine 0.5%: Kenalog '40mg'$ /dL. Patient tolerated the procedure well without immediate complications.  After informed written and verbal consent, patient was seated on exam table. Left knee was prepped with alcohol swab and utilizing anterolateral approach, patient's left knee space was injected with 4:1  marcaine 0.5%: Kenalog '40mg'$ /dL. Patient tolerated the procedure well without immediate complications.    Impression and Recommendations:     The above documentation has been reviewed and is accurate and complete Lyndal Pulley, DO

## 2022-05-07 NOTE — Progress Notes (Unsigned)
[name] [address] [place] [address] Phone: [phone] Subjective:   Terry Roy, am serving as a scribe for Dr. Antoine Primas.  I'm seeing this patient by the request  of:  [name], MD  CC: Bilateral knee pain  [id number]  02/27/2022 Chronic problem with worsening symptoms.  Still wants to avoid any type of surgical intervention.  He does like the injections have been doing relatively well though.  Allows him to continue to be active including heavy lifting in the gym.  We discussed with patient that the more low impact exercises could be beneficial.  Follow-up with me again in 10 weeks otherwise.   Updated 05/08/2022 Terry Roy is a 72 y.o. male coming in with complaint of B knee pain. Patient states that he had relief from injections. Would like them again.  Expereiencing popping in posterior lateral L knee. Pain with workouts specifically with squats. Pain is sharp when he exerperiences the pop. Does not have swelling.     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   Stroke (HCC) 02/2019   TIA   Wears glasses    Past Surgical History:  Procedure Laterality Date   HERNIA REPAIR     ventral hernia    KNEE ARTHROSCOPY Bilateral    MASS EXCISION     mesenteric mass resection    TUMOR REMOVAL  February 2010   benign    Social History   Socioeconomic History   Marital status: Married    Spouse name: Not on file   Number of children: Not on file   Years of education: Not on file   Highest education level: Not on file  Occupational History   Occupation: Retired  Tobacco Use   Smoking status: Never   Smokeless tobacco: Never  Substance and Sexual Activity    Alcohol use: Yes    Comment: socially   Drug use: No   Sexual activity: Not on file  Other Topics Concern   Not on file  Social History Narrative   Lives with wife   Right Handed   Drinks 1-2 cups caffeine daily   Social Determinants of Health   Financial Resource Strain: Not on file  Food Insecurity: Not on file  Transportation Needs: Not on file  Physical Activity: Not on file  Stress: Not on file  Social Connections: Not on file   Allergies  Allergen Reactions   Amlodipine Besylate     Other reaction(s): palpitations   Atorvastatin     Other reaction(s): leg weakness   Pravastatin Sodium     Other reaction(s): myalgias, stomach upset   Rosuvastatin     Other reaction(s): nausea, myalgias   Family History  Problem Relation Age of Onset   Osteoporosis Mother    Heart disease Father    Cancer Father        Leukemia   Bullous pemphigoid Sister      Current Outpatient Medications (Cardiovascular):    Evolocumab (REPATHA SURECLICK) 140 MG/ML SOAJ, Inject into the skin.   Current Outpatient Medications (Analgesics):    aspirin EC 81 MG tablet, Take 1 tablet (81 mg total) by mouth daily. Swallow whole.  Current Outpatient Medications (Other):    escitalopram (LEXAPRO) 10 MG tablet, Take 10 mg by mouth 2 (two) times daily.    fish oil-omega-3 fatty acids 1000 MG capsule, Take 2 g by mouth daily.   Glucosamine-Chondroitin (GLUCOSAMINE CHONDR COMPLEX PO), Take 2,400-3,000 mg by mouth 2 (two) times daily.   mirtazapine (REMERON) 30 MG tablet, Take 30 mg by mouth at bedtime.    omeprazole (PRILOSEC) 20 MG capsule, Take 20 mg by mouth 2 (two) times daily before a meal.   Reviewed prior external information including notes and imaging from  primary care provider As well as notes that were available from care everywhere and other healthcare systems.  Past medical history, social, surgical and family history all reviewed in electronic medical record.  No pertanent  information unless stated regarding to the chief complaint.   Review of Systems:  No headache, visual changes, nausea, vomiting, diarrhea, constipation, dizziness, abdominal pain, skin rash, fevers, chills, night sweats, weight loss, swollen lymph nodes, body aches, joint swelling, chest pain, shortness of breath, mood changes. POSITIVE muscle aches  Objective  Blood pressure 108/72, pulse 76, height 5\' 8"  (1.727 m), weight 226 lb (102.5 kg), SpO2 95 %.   General: No apparent distress alert and oriented x3 mood and affect normal, dressed appropriately.  HEENT: Pupils equal, extraocular movements intact  Respiratory: Patient's speak in full sentences and does not appear short of breath  Cardiovascular: No lower extremity edema, non tender, no erythema  Bilateral knees do have some crepitus noted.  Mild swelling lacking the last 10 degrees of flexion bilaterally.  More tenderness noted on the left knee than the right.  After informed written and verbal consent, patient was seated on exam table. Right knee was prepped with alcohol swab and utilizing anterolateral approach, patient's right knee space was injected with 4:1  marcaine 0.5%: Kenalog 40mg /dL. Patient tolerated the procedure well without immediate complications.  After informed written and verbal consent, patient was seated on exam table. Left knee was prepped with alcohol swab and utilizing anterolateral approach, patient's left knee space was injected with 4:1  marcaine 0.5%: Kenalog 40mg /dL. Patient tolerated the procedure well without immediate complications.    Impression and Recommendations:      The above documentation has been reviewed and is accurate and complete Judi Saa, DO

## 2022-05-08 ENCOUNTER — Encounter: Payer: Self-pay | Admitting: Family Medicine

## 2022-05-08 ENCOUNTER — Ambulatory Visit: Payer: PPO | Admitting: Family Medicine

## 2022-05-08 VITALS — BP 108/72 | HR 76 | Ht 68.0 in | Wt 226.0 lb

## 2022-05-08 DIAGNOSIS — M170 Bilateral primary osteoarthritis of knee: Secondary | ICD-10-CM

## 2022-05-08 NOTE — Patient Instructions (Signed)
You are great  Stay active  A little flexion at the beginning of your squat  See me again in 10 weeks

## 2022-05-08 NOTE — Assessment & Plan Note (Addendum)
Repeat injection given today, tolerated the procedure well, discussed icing regimen and home exercises.  Discussed compression.  Follow-up again in 6 to 8 weeks discussed with patient again if worsening symptoms may need to consider the possibility of surgical intervention.  Patient's BMI is now under 35 patient would like to avoid that if possible.  Still able to workout on a regular basis.  Follow-up again in 10 to 12 weeks

## 2022-07-12 NOTE — Progress Notes (Signed)
[name] [address] [place] [address] Phone: [phone] Subjective:    I'm seeing this patient by the request  of:  [name], MD  CC: Bilateral knee pain  [id number]  05/08/2022 You are great  Stay active  A little flexion at the beginning of your squat  See me again in 10 weeks   Updated 07/17/2022 Terry Roy is a 72 y.o. male coming in with complaint of B knee pain states the past few weeks have been sore patient has not been quite as active as usual.  Patient would like to be and hopefully will be soon.  Was building a deck recently and thinks that that has exacerbated some of it.      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   Stroke (HCC) 02/2019   TIA   Wears glasses    Past Surgical History:  Procedure Laterality Date   HERNIA REPAIR     ventral hernia    KNEE ARTHROSCOPY Bilateral    MASS EXCISION     mesenteric mass resection    TUMOR REMOVAL  February 2010   benign    Social History   Socioeconomic History   Marital status: Married    Spouse name: Not on file   Number of children: Not on file   Years of education: Not on file   Highest education level: Not on file  Occupational History   Occupation: Retired  Tobacco Use   Smoking status: Never   Smokeless tobacco: Never  Substance and Sexual Activity   Alcohol use: Yes    Comment: socially   Drug use: No   Sexual activity: Not on file  Other Topics Concern   Not on file  Social History Narrative   Lives with wife   Right Handed   Drinks 1-2 cups caffeine daily   Social Determinants of Health   Financial Resource Strain: Not on file  Food Insecurity: Not on file  Transportation Needs:  Not on file  Physical Activity: Not on file  Stress: Not on file  Social Connections: Not on file   Allergies  Allergen Reactions   Amlodipine Besylate     Other reaction(s): palpitations   Atorvastatin     Other reaction(s): leg weakness   Pravastatin Sodium     Other reaction(s): myalgias, stomach upset   Rosuvastatin     Other reaction(s): nausea, myalgias   Family History  Problem Relation Age of Onset   Osteoporosis Mother    Heart disease Father    Cancer Father        Leukemia   Bullous pemphigoid Sister      Current Outpatient Medications (Cardiovascular):    Evolocumab (REPATHA SURECLICK) 140 MG/ML SOAJ, Inject into the skin.   Current Outpatient Medications (Analgesics):    aspirin EC 81 MG tablet, Take 1 tablet (81 mg total) by mouth daily. Swallow whole.   Current Outpatient Medications (Other):    escitalopram (LEXAPRO) 10 MG tablet, Take 10 mg by mouth 2 (two) times daily.    fish oil-omega-3 fatty acids 1000 MG capsule, Take 2 g by mouth daily.   Glucosamine-Chondroitin (GLUCOSAMINE CHONDR COMPLEX PO), Take  2,400-3,000 mg by mouth 2 (two) times daily.   mirtazapine (REMERON) 30 MG tablet, Take 30 mg by mouth at bedtime.    omeprazole (PRILOSEC) 20 MG capsule, Take 20 mg by mouth 2 (two) times daily before a meal.   Reviewed prior external information including notes and imaging from  primary care provider As well as notes that were available from care everywhere and other healthcare systems.  Past medical history, social, surgical and family history all reviewed in electronic medical record.  No pertanent information unless stated regarding to the chief complaint.   Review of Systems:  No headache, visual changes, nausea, vomiting, diarrhea, constipation, dizziness, abdominal pain, skin rash, fevers, chills, night sweats, weight loss, swollen lymph nodes, body aches, joint swelling, chest pain, shortness of breath, mood changes. POSITIVE muscle  aches  Objective  Blood pressure 124/80, pulse 69, height 5\' 8"  (1.727 m), weight 226 lb (102.5 kg), SpO2 98 %.   General: No apparent distress alert and oriented x3 mood and affect normal, dressed appropriately.  HEENT: Pupils equal, extraocular movements intact  Respiratory: Patient's speak in full sentences and does not appear short of breath  Cardiovascular: No lower extremity edema, non tender, no erythema  Antalgic gait noted.  Patient does have trace amount of effusion of the knees bilaterally.  Does have some crepitus noted with range of motion.  After informed written and verbal consent, patient was seated on exam table. Right knee was prepped with alcohol swab and utilizing anterolateral approach, patient's right knee space was injected with 4:1  marcaine 0.5%: Kenalog 40mg /dL. Patient tolerated the procedure well without immediate complications.  After informed written and verbal consent, patient was seated on exam table. Left knee was prepped with alcohol swab and utilizing anterolateral approach, patient's left knee space was injected with 4:1  marcaine 0.5%: Kenalog 40mg /dL. Patient tolerated the procedure well without immediate complications.    Impression and Recommendations:    The above documentation has been reviewed and is accurate and complete Judi Saa, DO

## 2022-07-17 ENCOUNTER — Ambulatory Visit: Payer: PPO | Admitting: Family Medicine

## 2022-07-17 VITALS — BP 124/80 | HR 69 | Ht 68.0 in | Wt 226.0 lb

## 2022-07-17 DIAGNOSIS — M170 Bilateral primary osteoarthritis of knee: Secondary | ICD-10-CM

## 2022-07-17 NOTE — Patient Instructions (Signed)
Good to see you  You know the drill  10 week follow up

## 2022-07-17 NOTE — Assessment & Plan Note (Signed)
Repeat injection given today, tolerated the procedure well, discussed icing regimen and home exercises, discussed which activities to do and which ones to avoid.  Chronic problem with worsening symptoms.  Still wants to avoid surgical intervention.  Follow-up again in 10 weeks

## 2022-07-18 ENCOUNTER — Encounter: Payer: Self-pay | Admitting: Family Medicine

## 2022-09-19 NOTE — Progress Notes (Signed)
[facility] [address] [place] [address] Phone: [phone] Subjective:   Terry Roy, am serving as a scribe for Dr. Antoine Primas.  I'm seeing this patient by the request  of:  [name], MD  CC: bilateral knee pain   [id number]  07/17/2022 Repeat injection given today, tolerated the procedure well, discussed icing regimen and home exercises, discussed which activities to do and which ones to avoid.  Chronic problem with worsening symptoms.  Still wants to avoid surgical intervention.  Follow-up again in 10 weeks      Update 09/26/2022 Terry Roy is a 72 y.o. male coming in with complaint of B knee pain. Patient states that he is doing better. Feels like Voltaren gel has helped.        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   Stroke (HCC) 02/2019   TIA   Wears glasses    Past Surgical History:  Procedure Laterality Date   HERNIA REPAIR     ventral hernia    KNEE ARTHROSCOPY Bilateral    MASS EXCISION     mesenteric mass resection    TUMOR REMOVAL  February 2010   benign    Social History   Socioeconomic History   Marital status: Married    Spouse name: Not on file   Number of children: Not on file   Years of education: Not on file   Highest education level: Not on file  Occupational History   Occupation: Retired  Tobacco Use   Smoking status: Never   Smokeless tobacco: Never  Substance and Sexual Activity   Alcohol use: Yes    Comment: socially   Drug use: No   Sexual activity: Not on file  Other Topics Concern   Not on file  Social History Narrative   Lives with wife   Right Handed   Drinks 1-2 cups caffeine daily   Social Determinants of Health    Financial Resource Strain: Not on file  Food Insecurity: Not on file  Transportation Needs: Not on file  Physical Activity: Not on file  Stress: Not on file  Social Connections: Not on file   Allergies  Allergen Reactions   Amlodipine Besylate     Other reaction(s): palpitations   Atorvastatin     Other reaction(s): leg weakness   Pravastatin Sodium     Other reaction(s): myalgias, stomach upset   Rosuvastatin     Other reaction(s): nausea, myalgias   Family History  Problem Relation Age of Onset   Osteoporosis Mother    Heart disease Father    Cancer Father        Leukemia   Bullous pemphigoid Sister      Current Outpatient Medications (Cardiovascular):    Evolocumab (REPATHA SURECLICK) 140 MG/ML SOAJ, Inject into the skin.   Current Outpatient Medications (Analgesics):    aspirin EC 81 MG tablet, Take 1 tablet (81 mg total) by mouth daily. Swallow whole.   Current Outpatient Medications (Other):    escitalopram (LEXAPRO) 10 MG tablet, Take 10 mg by mouth 2 (two) times daily.    fish oil-omega-3 fatty acids 1000 MG capsule, Take 2 g by mouth daily.  Glucosamine-Chondroitin (GLUCOSAMINE CHONDR COMPLEX PO), Take 2,400-3,000 mg by mouth 2 (two) times daily.   mirtazapine (REMERON) 30 MG tablet, Take 30 mg by mouth at bedtime.    omeprazole (PRILOSEC) 20 MG capsule, Take 20 mg by mouth 2 (two) times daily before a meal.   Reviewed prior external information including notes and imaging from  primary care provider As well as notes that were available from care everywhere and other healthcare systems.  Past medical history, social, surgical and family history all reviewed in electronic medical record.  No pertanent information unless stated regarding to the chief complaint.   Review of Systems:  No headache, visual changes, nausea, vomiting, diarrhea, constipation, dizziness, abdominal pain, skin rash, fevers, chills, night sweats, weight loss, swollen lymph nodes,  body aches, joint swelling, chest pain, shortness of breath, mood changes. POSITIVE muscle aches  Objective  Blood pressure 122/78, pulse 77, height 5\' 8"  (1.727 m), weight 230 lb (104.3 kg), SpO2 97%.   General: No apparent distress alert and oriented x3 mood and affect normal, dressed appropriately.  HEENT: Pupils equal, extraocular movements intact  Respiratory: Patient's speak in full sentences and does not appear short of breath  Cardiovascular: No lower extremity edema, non tender, no erythema  Bilateral knees show arthritic changes noted.  Crepitus noted. Patient does have more swelling on the right knee than left knee.  Patient does have some instability with valgus and varus force right knee.  After informed written and verbal consent, patient was seated on exam table. Right knee was prepped with alcohol swab and utilizing anterolateral approach, patient's right knee space was injected with 4:1  marcaine 0.5%: Kenalog 40mg /dL. Patient tolerated the procedure well without immediate complications.  After informed written and verbal consent, patient was seated on exam table. Left knee was prepped with alcohol swab and utilizing anterolateral approach, patient's left knee space was injected with 4:1  marcaine 0.5%: Kenalog 40mg /dL. Patient tolerated the procedure well without immediate complications.     Impression and Recommendations:     The above documentation has been reviewed and is accurate and complete Judi Saa, DO

## 2022-09-26 ENCOUNTER — Ambulatory Visit: Payer: PPO | Admitting: Family Medicine

## 2022-09-26 ENCOUNTER — Encounter: Payer: Self-pay | Admitting: Family Medicine

## 2022-09-26 VITALS — BP 122/78 | HR 77 | Ht 68.0 in | Wt 230.0 lb

## 2022-09-26 DIAGNOSIS — M170 Bilateral primary osteoarthritis of knee: Secondary | ICD-10-CM | POA: Diagnosis not present

## 2022-09-26 NOTE — Patient Instructions (Addendum)
Injection in knees today See you again in 10 weeks

## 2022-09-26 NOTE — Assessment & Plan Note (Signed)
Chronic, with worsening symptoms.  Does have BMI under 35.  Still does not want any surgical intervention.  Wants to continue to stay active and continue to work out.  Patient's goal is to continue to do his squatting.  Discussed icing regimen and home exercises, discussed which activities to do and which ones to avoid.  Follow-up again in 6 to 8 weeks otherwise.

## 2022-10-24 ENCOUNTER — Ambulatory Visit: Payer: PPO | Admitting: Sports Medicine

## 2022-10-24 VITALS — BP 122/78 | HR 76 | Ht 68.0 in | Wt 225.0 lb

## 2022-10-24 DIAGNOSIS — M7062 Trochanteric bursitis, left hip: Secondary | ICD-10-CM

## 2022-10-24 NOTE — Progress Notes (Signed)
[facility] [address] [place] [address] Phone: [phone]   Assessment and Plan:     1. Greater trochanteric bursitis of left hip - Acute, uncomplicated, initial sports medicine visit - Most consistent with left-sided greater trochanteric bursitis.  Likely flared with patient trying new physical activity targeting gluteal muscle strengthening leading to increased tension on greater trochanter and resulting in bursitis.  Patient does have osteoarthritis of left hip, lumbar degenerative changes, however based on physical exam, I feel greater trochanteric bursitis is most likely cause of current pain flare - Patient has been using prednisone Dosepak without significant relief.  Patient may complete prednisone Dosepak - Patient elected for greater trochanteric CSI.  Tolerated well per note below - Start HEP for gluteal musculature -Recommend gradual return to physical activity.  Start activity at 50% (speed, duration, reps, sets, intensity) and allow 24 hours to assess for worsening pain.  If 50% is well-tolerated, may increase next activity to 75%.  If 75% is well-tolerated, may increase next physical activity to 100%.  If any of these levels cause pain, recommend dropping down to previous level for an additional 2-3 attempts before advancing.  15 additional minutes spent for educating Therapeutic Home Exercise Program.  This included exercises focusing on stretching, strengthening, with focus on eccentric aspects.   Long term goals include an improvement in range of motion, strength, endurance as well as avoiding reinjury. Patient's frequency would include in 1-2 times a day, 3-5 times a week for a duration of 6-12 weeks. Proper technique shown and discussed handout in great detail with ATC.  All questions were discussed and answered.    Procedure: Greater trochanteric bursal injection Side: Left  Risks explained and consent was given  verbally. The site was cleaned with alcohol prep. A steroid injection was performed with patient in the lateral side-lying position at area of maximum tenderness over greater trochanter using 2mL of 1% lidocaine without epinephrine and 1mL of kenalog 40mg /ml. This was well tolerated and resulted in symptomatic relief.  Needle was removed, hemostasis achieved, and post injection instructions were explained.  Pt was advised to call or return to clinic if these symptoms worsen or fail to improve as anticipated.     Pertinent previous records reviewed include lumbar MRI 12/20/2021, lumbar x-ray 10/10/2021, CT abdomen 03/03/2019   Follow Up: As needed if no improvement or worsening of symptoms in 3 to 4 weeks.  Would further consider intra-articular hip versus lumbar etiology of pain if no improvement   Subjective:   I, Terry Roy, am serving as a Neurosurgeon for Doctor Terry Roy  Chief Complaint: low back pain   HPI:   10/24/22 Patient is a 72 year old male complaining of low back pain. Patient states that his pain strted a week and a half ago. Pain in the glute that radiates around and down the leg. Did to much at the gym. Tylenol for the pain . Prednisone dos pak 10 mg and he can't tell if its helping . Pain with laying down . Pain in the hip more than low back   Relevant Historical Information: GERD, hypertension  Additional pertinent review of systems negative.   Current Outpatient Medications:    aspirin EC 81 MG tablet, Take 1 tablet (81 mg total) by mouth daily. Swallow whole., Disp: 30 tablet, Rfl: 11   escitalopram (LEXAPRO) 10 MG tablet, Take 10 mg by mouth 2 (two) times daily. , Disp: , Rfl:  Evolocumab (REPATHA SURECLICK) 140 MG/ML SOAJ, Inject into the skin., Disp: , Rfl:    fish oil-omega-3 fatty acids 1000 MG capsule, Take 2 g by mouth daily., Disp: , Rfl:    Glucosamine-Chondroitin (GLUCOSAMINE CHONDR COMPLEX PO), Take 2,400-3,000 mg by mouth 2 (two) times daily., Disp: ,  Rfl:    mirtazapine (REMERON) 30 MG tablet, Take 30 mg by mouth at bedtime. , Disp: , Rfl:    omeprazole (PRILOSEC) 20 MG capsule, Take 20 mg by mouth 2 (two) times daily before a meal., Disp: , Rfl:    Objective:     Vitals:   10/24/22 0956  BP: 122/78  Pulse: 76  SpO2: 96%  Weight: 225 lb (102.1 kg)  Height: 5\' 8"  (1.727 m)      Body mass index is 34.21 kg/m.    Physical Exam:    General: awake, alert, and oriented no acute distress, nontoxic Skin: no suspicious lesions or rashes Neuro:sensation intact distally with no deficits, normal muscle tone, no atrophy, strength 5/5 in all tested lower ext groups Psych: normal mood and affect, speech clear   Left hip: No deformity, swelling or wasting ROM Flexion 90, ext 30, IR 45 with lateral pain, ER 45 with lateral pain TTP greater trochanter, gluteal musculature, IT band NTTP over the hip flexors,  si joint, lumbar spine Negative log roll with FROM Negative FABER Positive FADIR for lateral hip pain Negative Piriformis test for radicular symptoms, though reproduced left lateral hip pain Positive trendelenberg on left Gait normal    Electronically signed by:  [facility] 10:32 AM 10/24/22

## 2022-10-24 NOTE — Patient Instructions (Signed)
Hip HEP  As needed follow up if no improvement 3-4 week follow up

## 2022-10-28 ENCOUNTER — Encounter: Payer: Self-pay | Admitting: Sports Medicine

## 2022-10-29 ENCOUNTER — Other Ambulatory Visit: Payer: Self-pay | Admitting: Sports Medicine

## 2022-10-29 MED ORDER — CYCLOBENZAPRINE HCL 5 MG PO TABS: 5.0000 mg | ORAL_TABLET | Freq: Every day | ORAL | 0 refills | Status: DC

## 2022-10-29 MED ORDER — MELOXICAM 15 MG PO TABS: 15.0000 mg | ORAL_TABLET | Freq: Every day | ORAL | 0 refills | Status: DC

## 2022-10-29 NOTE — Progress Notes (Unsigned)
Medication sent to pharmacy  

## 2022-10-29 NOTE — Telephone Encounter (Signed)
Patient messaged with instructions about meloxicam and flexeril

## 2022-11-06 ENCOUNTER — Ambulatory Visit: Payer: PPO | Admitting: Family Medicine

## 2022-11-06 ENCOUNTER — Ambulatory Visit (INDEPENDENT_AMBULATORY_CARE_PROVIDER_SITE_OTHER): Payer: PPO

## 2022-11-06 ENCOUNTER — Encounter: Payer: Self-pay | Admitting: Family Medicine

## 2022-11-06 VITALS — BP 142/90 | HR 84 | Ht 68.0 in | Wt 220.0 lb

## 2022-11-06 DIAGNOSIS — M25552 Pain in left hip: Secondary | ICD-10-CM

## 2022-11-06 DIAGNOSIS — M5416 Radiculopathy, lumbar region: Secondary | ICD-10-CM | POA: Diagnosis not present

## 2022-11-06 DIAGNOSIS — M5126 Other intervertebral disc displacement, lumbar region: Secondary | ICD-10-CM

## 2022-11-06 MED ORDER — GABAPENTIN 100 MG PO CAPS: 200.0000 mg | ORAL_CAPSULE | Freq: Every day | ORAL | 0 refills | Status: DC

## 2022-11-06 MED ORDER — TIZANIDINE HCL 4 MG PO TABS: 4.0000 mg | ORAL_TABLET | Freq: Every day | ORAL | 0 refills | Status: DC

## 2022-11-06 NOTE — Patient Instructions (Addendum)
Xray today Epidural L3/L4 Gabapentin 200mg   Stop Flexeril  Zanaflex 4mg  as needed See me in 4-6 weeks after injection

## 2022-11-06 NOTE — Assessment & Plan Note (Signed)
Concerned because I do believe that patient's leg pain and numbness is secondary to more of a nerve impingement noted.  Do believe it is the L3-L4 level that is contributing to it.  Discussed icing regimen and home exercises, discussed which activities to do and which ones to avoid.  Do believe that a possible epidural when reviewing patient's MRI at this level could be significantly helpful.  Increase activity slowly over the course of next several weeks.  Discussed icing regimen.  Follow-up with me again in 6 to 8 weeks otherwise. Restart

## 2022-11-06 NOTE — Progress Notes (Signed)
[name] [address] [place] [address] Phone: [phone] Subjective:   Terry Roy, am serving as a scribe for Dr. Antoine Primas.  I'm seeing this patient by the request  of:  [name], MD  CC: Left hip pain  [id number]  Terry Roy is a 72 y.o. male coming in with complaint of L hip pain. Injection from Dr. [name] on 10/24/2022. Patient states that his pain has started to move into the front of thigh and down into the quad. Also having pain in groin and adductor. Numbness at the medial aspect of knee. Using flexeril, meloxicam, TENS, heat, massage, ice, topical analgesic daily. Pain has been constant since Sunday but has improved somewhat since last week.      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   Stroke (HCC) 02/2019   TIA   Wears glasses    Past Surgical History:  Procedure Laterality Date   HERNIA REPAIR     ventral hernia    KNEE ARTHROSCOPY Bilateral    MASS EXCISION     mesenteric mass resection    TUMOR REMOVAL  February 2010   benign    Social History   Socioeconomic History   Marital status: Married    Spouse name: Not on file   Number of children: Not on file   Years of education: Not on file   Highest education level: Not on file  Occupational History   Occupation: Retired  Tobacco Use   Smoking status: Never   Smokeless tobacco: Never  Substance and Sexual Activity   Alcohol use: Yes    Comment: socially   Drug use: No   Sexual activity: Not on file  Other Topics Concern   Not on file  Social History Narrative   Lives with wife   Right Handed   Drinks 1-2 cups caffeine daily   Social Determinants of Health   Financial Resource  Strain: Not on file  Food Insecurity: Not on file  Transportation Needs: Not on file  Physical Activity: Not on file  Stress: Not on file  Social Connections: Not on file   Allergies  Allergen Reactions   Amlodipine Besylate     Other reaction(s): palpitations   Atorvastatin     Other reaction(s): leg weakness   Pravastatin Sodium     Other reaction(s): myalgias, stomach upset   Rosuvastatin     Other reaction(s): nausea, myalgias   Family History  Problem Relation Age of Onset   Osteoporosis Mother    Heart disease Father    Cancer Father        Leukemia   Bullous pemphigoid Sister      Current Outpatient Medications (Cardiovascular):    Evolocumab (REPATHA SURECLICK) 140 MG/ML SOAJ, Inject into the skin.   Current Outpatient Medications (Analgesics):    aspirin EC 81 MG tablet, Take 1 tablet (81 mg total) by mouth daily. Swallow whole.   meloxicam (MOBIC) 15 MG tablet, Take 1 tablet (15 mg total) by mouth daily.   Current Outpatient Medications (Other):    cyclobenzaprine (FLEXERIL) 5 MG tablet, Take 1 tablet (5 mg total) by mouth at bedtime.   escitalopram (LEXAPRO)  10 MG tablet, Take 10 mg by mouth 2 (two) times daily.    fish oil-omega-3 fatty acids 1000 MG capsule, Take 2 g by mouth daily.   gabapentin (NEURONTIN) 100 MG capsule, Take 2 capsules (200 mg total) by mouth at bedtime.   Glucosamine-Chondroitin (GLUCOSAMINE CHONDR COMPLEX PO), Take 2,400-3,000 mg by mouth 2 (two) times daily.   mirtazapine (REMERON) 30 MG tablet, Take 30 mg by mouth at bedtime.    omeprazole (PRILOSEC) 20 MG capsule, Take 20 mg by mouth 2 (two) times daily before a meal.   tiZANidine (ZANAFLEX) 4 MG tablet, Take 1 tablet (4 mg total) by mouth at bedtime.   Reviewed prior external information including notes and imaging from  primary care provider As well as notes that were available from care everywhere and other healthcare systems.  Past medical history, social, surgical and  family history all reviewed in electronic medical record.  No pertanent information unless stated regarding to the chief complaint.   Review of Systems:  No headache, visual changes, nausea, vomiting, diarrhea, constipation, dizziness, abdominal pain, skin rash, fevers, chills, night sweats, weight loss, swollen lymph nodes, body aches, joint swelling, chest pain, shortness of breath, mood changes. POSITIVE muscle aches  Objective  Blood pressure (!) 142/90, pulse 84, height 5\' 8"  (1.727 m), weight 220 lb (99.8 kg), SpO2 96%.   General: No apparent distress alert and oriented x3 mood and affect normal, dressed appropriately.  HEENT: Pupils equal, extraocular movements intact  Respiratory: Patient's speak in full sentences and does not appear short of breath  Cardiovascular: No lower extremity edema, non tender, no erythema  Low back exam does have significant loss of lordosis.  Positive straight leg test noted on the left side.  Patient has worsening pain with any extension or flexion of the lower back.  Does have weakness noted with dorsi flexion and plantarflexion of the foot.  Does have some 4 out of 5 strength noted of the hip flexor also noted.    Impression and Recommendations:      The above documentation has been reviewed and is accurate and complete Judi Saa, DO

## 2022-11-08 ENCOUNTER — Encounter: Payer: Self-pay | Admitting: Family Medicine

## 2022-11-08 MED ORDER — TRAMADOL HCL 50 MG PO TABS: 50.0000 mg | ORAL_TABLET | Freq: Three times a day (TID) | ORAL | 0 refills | Status: AC | PRN

## 2022-11-09 NOTE — Discharge Instructions (Signed)

## 2022-11-12 ENCOUNTER — Inpatient Hospital Stay
Admission: RE | Admit: 2022-11-12 | Discharge: 2022-11-12 | Disposition: A | Discharge status: Home / Self Care | Payer: PPO | Source: Ambulatory Visit | Attending: Family Medicine | Admitting: Family Medicine

## 2022-11-12 DIAGNOSIS — M5416 Radiculopathy, lumbar region: Secondary | ICD-10-CM

## 2022-11-12 MED ORDER — METHYLPREDNISOLONE ACETATE 40 MG/ML INJ SUSP (RADIOLOG
80.0000 mg | Freq: Once | INTRAMUSCULAR | Status: AC
  Administered 2022-11-12: 80 mg via EPIDURAL

## 2022-11-12 MED ORDER — IOPAMIDOL (ISOVUE-M 200) INJECTION 41%
1.0000 mL | Freq: Once | INTRAMUSCULAR | Status: AC
  Administered 2022-11-12: 1 mL via EPIDURAL

## 2022-12-05 ENCOUNTER — Ambulatory Visit: Payer: PPO | Admitting: Family Medicine

## 2022-12-06 ENCOUNTER — Ambulatory Visit: Payer: PPO | Admitting: Family Medicine

## 2022-12-06 NOTE — Progress Notes (Signed)
[facility] [address] [place] [address] Phone: [phone] Subjective:   Terry Roy, am serving as a scribe for Dr. Antoine Primas.  I'm seeing this patient by the request  of:  [name], MD  CC: Low back pain follow-up, bilateral knee pain  [id number]  11/06/2022 Concerned because I do believe that patient's leg pain and numbness is secondary to more of a nerve impingement noted.  Do believe it is the L3-L4 level that is contributing to it.  Discussed icing regimen and home exercises, discussed which activities to do and which ones to avoid.  Do believe that a possible epidural when reviewing patient's MRI at this level could be significantly helpful.  Increase activity slowly over the course of next several weeks.  Discussed icing regimen.  Follow-up with me again in 6 to 8 weeks otherwise. Restart     Update 12/10/2022 Terry Roy is a 72 y.o. male coming in with complaint of lumbar spine pain. Patient states had epidural 10/21, back is doing better. Inner L thigh still numb. When active doesn't feel anything. Knee injections today as well. Has a couple about R thigh L shoulder. L shoulder pain over AC joint.      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   Stroke (HCC) 02/2019   TIA   Wears glasses    Past Surgical History:  Procedure Laterality Date   HERNIA REPAIR     ventral hernia    KNEE ARTHROSCOPY Bilateral    MASS EXCISION     mesenteric mass resection    TUMOR REMOVAL  February 2010   benign    Social History   Socioeconomic History   Marital status: Married    Spouse name: Not on file   Number of children: Not on file   Years of education: Not on file    Highest education level: Not on file  Occupational History   Occupation: Retired  Tobacco Use   Smoking status: Never   Smokeless tobacco: Never  Substance and Sexual Activity   Alcohol use: Yes    Comment: socially   Drug use: No   Sexual activity: Not on file  Other Topics Concern   Not on file  Social History Narrative   Lives with wife   Right Handed   Drinks 1-2 cups caffeine daily   Social Determinants of Health   Financial Resource Strain: Not on file  Food Insecurity: Not on file  Transportation Needs: Not on file  Physical Activity: Not on file  Stress: Not on file  Social Connections: Not on file   Allergies  Allergen Reactions   Amlodipine Besylate     Other reaction(s): palpitations   Atorvastatin     Other reaction(s): leg weakness   Pravastatin Sodium     Other reaction(s): myalgias, stomach upset   Rosuvastatin     Other reaction(s): nausea, myalgias   Family History  Problem Relation Age of Onset   Osteoporosis Mother    Heart disease Father    Cancer Father        Leukemia   Bullous pemphigoid Sister      Current Outpatient Medications (Cardiovascular):  Evolocumab (REPATHA SURECLICK) 140 MG/ML SOAJ, Inject into the skin.   Current Outpatient Medications (Analgesics):    aspirin EC 81 MG tablet, Take 1 tablet (81 mg total) by mouth daily. Swallow whole.   meloxicam (MOBIC) 15 MG tablet, Take 1 tablet (15 mg total) by mouth daily.   Current Outpatient Medications (Other):    cyclobenzaprine (FLEXERIL) 5 MG tablet, Take 1 tablet (5 mg total) by mouth at bedtime.   escitalopram (LEXAPRO) 10 MG tablet, Take 10 mg by mouth 2 (two) times daily.    fish oil-omega-3 fatty acids 1000 MG capsule, Take 2 g by mouth daily.   gabapentin (NEURONTIN) 100 MG capsule, Take 2 capsules (200 mg total) by mouth at bedtime.   Glucosamine-Chondroitin (GLUCOSAMINE CHONDR COMPLEX PO), Take 2,400-3,000 mg by mouth 2 (two) times daily.   mirtazapine (REMERON) 30  MG tablet, Take 30 mg by mouth at bedtime.    omeprazole (PRILOSEC) 20 MG capsule, Take 20 mg by mouth 2 (two) times daily before a meal.   tiZANidine (ZANAFLEX) 4 MG tablet, Take 1 tablet (4 mg total) by mouth at bedtime.   Reviewed prior external information including notes and imaging from  primary care provider As well as notes that were available from care everywhere and other healthcare systems.  Past medical history, social, surgical and family history all reviewed in electronic medical record.  No pertanent information unless stated regarding to the chief complaint.   Review of Systems:  No headache, visual changes, nausea, vomiting, diarrhea, constipation, dizziness, abdominal pain, skin rash, fevers, chills, night sweats, weight loss, swollen lymph nodes, body aches, joint swelling, chest pain, shortness of breath, mood changes. POSITIVE muscle aches  Objective  Blood pressure (!) 142/86, pulse 85, height 5\' 8"  (1.727 m), weight 223 lb (101.2 kg), SpO2 97%.   General: No apparent distress alert and oriented x3 mood and affect normal, dressed appropriately.  HEENT: Pupils equal, extraocular movements intact  Respiratory: Patient's speak in full sentences and does not appear short of breath  Cardiovascular: No lower extremity edema, non tender, no erythema  Low back does have some loss lordosis noted.  Some tightness noted with straight leg test but no true radicular symptoms today. Knees do have significant arthritic changes bilaterally.  Crepitus noted.  Does have trace effusion noted bilaterally  Left shoulder does have some loss of range of motion with internal and external range of motion.  Positive impingement signs noted.  Positive O'Brien's rotator cuff appears to be in tact  After informed written and verbal consent, patient was seated on exam table. Right knee was prepped with alcohol swab and utilizing anterolateral approach, patient's right knee space was injected with  4:1  marcaine 0.5%: Kenalog 40mg /dL. Patient tolerated the procedure well without immediate complications.  After informed written and verbal consent, patient was seated on exam table. Left knee was prepped with alcohol swab and utilizing anterolateral approach, patient's left knee space was injected with 4:1  marcaine 0.5%: Kenalog 40mg /dL. Patient tolerated the procedure well without immediate complications.    Impression and Recommendations:     The above documentation has been reviewed and is accurate and complete Judi Saa, DO

## 2022-12-10 ENCOUNTER — Encounter: Payer: Self-pay | Admitting: Family Medicine

## 2022-12-10 ENCOUNTER — Ambulatory Visit (INDEPENDENT_AMBULATORY_CARE_PROVIDER_SITE_OTHER): Payer: PPO

## 2022-12-10 ENCOUNTER — Ambulatory Visit: Payer: PPO | Admitting: Family Medicine

## 2022-12-10 VITALS — BP 142/86 | HR 85 | Ht 68.0 in | Wt 223.0 lb

## 2022-12-10 DIAGNOSIS — G8929 Other chronic pain: Secondary | ICD-10-CM | POA: Diagnosis not present

## 2022-12-10 DIAGNOSIS — M25512 Pain in left shoulder: Secondary | ICD-10-CM | POA: Diagnosis not present

## 2022-12-10 DIAGNOSIS — M5442 Lumbago with sciatica, left side: Secondary | ICD-10-CM | POA: Diagnosis not present

## 2022-12-10 DIAGNOSIS — M170 Bilateral primary osteoarthritis of knee: Secondary | ICD-10-CM | POA: Diagnosis not present

## 2022-12-10 NOTE — Assessment & Plan Note (Signed)
Has had protruding disc of the low back.  We may need another epidural.  Will continue to monitor

## 2022-12-10 NOTE — Assessment & Plan Note (Signed)
Does have some impingement.  Will continue to monitor, discussed with patient about icing regimen and avoiding certain range of motion.  Worsening pain consider ultrasound at follow-up.  X-rays pending

## 2022-12-10 NOTE — Patient Instructions (Addendum)
Injections in knees today Exercises for labrum  L shoulder xray Write [place] for another injection See me in 8-10 weeks

## 2022-12-10 NOTE — Assessment & Plan Note (Signed)
Chronic problem with worsening symptoms, continues to be significantly active.  BMI is under 35% indicating a potential candidate for surgical intervention.  Discussed with patient that icing regimen and home exercises otherwise, which activities to do which ones to avoid, increase activity slowly over the course of next several weeks.  Discussed icing regimen follow-up again in 6 to 8 weeks otherwise

## 2023-02-15 NOTE — Progress Notes (Unsigned)
[name] [address] [place] [address] Phone: [phone] Subjective:   [name], am serving as a scribe for Dr. Antoine Primas.  I'm seeing this patient by the request  of:  [name], MD  CC: Bilateral knee pain  BJY:[id number]  12/10/2022 Does have some impingement.  Will continue to monitor, discussed with patient about icing regimen and avoiding certain range of motion.  Worsening pain consider ultrasound at follow-up.  X-rays pending     Has had protruding disc of the low back.  We may need another epidural.  Will continue to monitor     Chronic problem with worsening symptoms, continues to be significantly active.  BMI is under 35% indicating a potential candidate for surgical intervention.  Discussed with patient that icing regimen and home exercises otherwise, which activities to do which ones to avoid, increase activity slowly over the course of next several weeks.  Discussed icing regimen follow-up again in 6 to 8 weeks otherwise     Updated 02/18/2023 LOWERY PAULLIN is a 73 y.o. male coming in with complaint of B knee, shoulder, and back pain. Overall he is doing well.  Continues to have some discomfort in multiple different joint splint seems to be the knees of the worst.  C/o pain in R thumb over UCL. Pain for past 4 months that is intermittent. No injury.  Only notices it with certain motions.  Points to more of the interphalangeal joint       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   Stroke (HCC) 02/2019   TIA   Wears glasses    Past Surgical History:  Procedure Laterality Date   HERNIA REPAIR     ventral hernia    KNEE ARTHROSCOPY Bilateral    MASS EXCISION      mesenteric mass resection    TUMOR REMOVAL  February 2010   benign    Social History   Socioeconomic History   Marital status: Married    Spouse name: Not on file   Number of children: Not on file   Years of education: Not on file   Highest education level: Not on file  Occupational History   Occupation: Retired  Tobacco Use   Smoking status: Never   Smokeless tobacco: Never  Substance and Sexual Activity   Alcohol use: Yes    Comment: socially   Drug use: No   Sexual activity: Not on file  Other Topics Concern   Not on file  Social History Narrative   Lives with wife   Right Handed   Drinks 1-2 cups caffeine daily   Social Drivers of Corporate investment banker Strain: Not on file  Food Insecurity: Not on file  Transportation Needs: Not on file  Physical Activity: Not on file  Stress: Not on file  Social Connections: Not on file   Allergies  Allergen Reactions   Amlodipine Besylate     Other reaction(s): palpitations   Atorvastatin     Other reaction(s): leg weakness   Pravastatin Sodium     Other reaction(s): myalgias, stomach upset   Rosuvastatin     Other reaction(s): nausea, myalgias  Family History  Problem Relation Age of Onset   Osteoporosis Mother    Heart disease Father    Cancer Father        Leukemia   Bullous pemphigoid Sister      Current Outpatient Medications (Cardiovascular):    Evolocumab (REPATHA SURECLICK) 140 MG/ML SOAJ, Inject into the skin.   Current Outpatient Medications (Analgesics):    aspirin EC 81 MG tablet, Take 1 tablet (81 mg total) by mouth daily. Swallow whole.   Current Outpatient Medications (Other):    escitalopram (LEXAPRO) 10 MG tablet, Take 10 mg by mouth 2 (two) times daily.    fish oil-omega-3 fatty acids 1000 MG capsule, Take 2 g by mouth daily.   Glucosamine-Chondroitin (GLUCOSAMINE CHONDR COMPLEX PO), Take 2,400-3,000 mg by mouth 2 (two) times daily.   mirtazapine (REMERON) 30 MG tablet, Take 30  mg by mouth at bedtime.    omeprazole (PRILOSEC) 20 MG capsule, Take 20 mg by mouth 2 (two) times daily before a meal.   Reviewed prior external information including notes and imaging from  primary care provider As well as notes that were available from care everywhere and other healthcare systems.  Past medical history, social, surgical and family history all reviewed in electronic medical record.  No pertanent information unless stated regarding to the chief complaint.   Review of Systems:  No headache, visual changes, nausea, vomiting, diarrhea, constipation, dizziness, abdominal pain, skin rash, fevers, chills, night sweats, weight loss, swollen lymph nodes, body aches, joint swelling, chest pain, shortness of breath, mood changes. POSITIVE muscle aches  Objective  Blood pressure (!) 142/90, pulse 71, height 5\' 8"  (1.727 m), weight 225 lb (102.1 kg), SpO2 96%.   General: No apparent distress alert and oriented x3 mood and affect normal, dressed appropriately.  HEENT: Pupils equal, extraocular movements intact  Respiratory: Patient's speak in full sentences and does not appear short of breath  Cardiovascular: No lower extremity edema, non tender, no erythema  Bilateral knees do have significant arthritic changes noted.  Crepitus noted.  Trace effusion noted bilaterally. Right thumb exam shows a patient does have tenderness over the IP joint.  No acute masses appreciated.  Limited muscular skeletal ultrasound was performed and interpreted by Antoine Primas, M  Limited ultrasound shows a patient does have some calcific changes noted of the IP joint that is consistent with bone spurring.  No hypoechoic changes noted otherwise. Spurring of the thumb joint  After informed written and verbal consent, patient was seated on exam table. Right knee was prepped with alcohol swab and utilizing anterolateral approach, patient's right knee space was injected with 4:1  marcaine 0.5%: Kenalog 40mg /dL.  Patient tolerated the procedure well without immediate complications.  After informed written and verbal consent, patient was seated on exam table. Left knee was prepped with alcohol swab and utilizing anterolateral approach, patient's left knee space was injected with 4:1  marcaine 0.5%: Kenalog 40mg /dL. Patient tolerated the procedure well without immediate complications.   Impression and Recommendations:    The above documentation has been reviewed and is accurate and complete Judi Saa, DO

## 2023-02-18 ENCOUNTER — Ambulatory Visit: Payer: PPO | Admitting: Family Medicine

## 2023-02-18 ENCOUNTER — Other Ambulatory Visit: Payer: Self-pay

## 2023-02-18 ENCOUNTER — Encounter: Payer: Self-pay | Admitting: Family Medicine

## 2023-02-18 VITALS — BP 142/90 | HR 71 | Ht 68.0 in | Wt 225.0 lb

## 2023-02-18 DIAGNOSIS — M79644 Pain in right finger(s): Secondary | ICD-10-CM | POA: Insufficient documentation

## 2023-02-18 DIAGNOSIS — M19011 Primary osteoarthritis, right shoulder: Secondary | ICD-10-CM

## 2023-02-18 DIAGNOSIS — M19012 Primary osteoarthritis, left shoulder: Secondary | ICD-10-CM | POA: Diagnosis not present

## 2023-02-18 DIAGNOSIS — M170 Bilateral primary osteoarthritis of knee: Secondary | ICD-10-CM

## 2023-02-18 NOTE — Patient Instructions (Signed)
Injection in knees today See you again in 10-12 weeks

## 2023-02-18 NOTE — Assessment & Plan Note (Signed)
Chronic problem with worsening symptoms again.  Still wants to stay extremely active.  Still does not want any surgical intervention.  Has responded well to the steroid injections previously.  Discussed icing regimen.  Follow-up again in 6 to 8 weeks

## 2023-02-18 NOTE — Assessment & Plan Note (Signed)
Spurring of the IP joint.  Discussed avoiding certain movements and putting pressure on the area.  Worsening pain can consider injection or the possibility of shockwave therapy

## 2023-04-22 NOTE — Progress Notes (Unsigned)
 [name] [address] [place] [address] Phone: [phone] Subjective:   Terry Roy am a scribe for Dr. Katrinka Blazing.   I'm seeing this patient by the request  of:  [name], MD  CC: Bilateral knee pain  [id number]  02/18/2023 Spurring of the IP joint.  Discussed avoiding certain movements and putting pressure on the area.  Worsening pain can consider injection or the possibility of shockwave therapy     Chronic problem with worsening symptoms again.  Still wants to stay extremely active.  Still does not want any surgical intervention.  Has responded well to the steroid injections previously.  Discussed icing regimen.  Follow-up again in 6 to 8 weeks   Updated 04/29/2023 Terry Roy is a 73 y.o. male coming in with complaint of B knee pain and thumb pain. Patient states right know the knees aren't bad at all. No thumb pain. It has been managed well.        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   Stroke (HCC) 02/2019   TIA   Wears glasses    Past Surgical History:  Procedure Laterality Date   HERNIA REPAIR     ventral hernia    KNEE ARTHROSCOPY Bilateral    MASS EXCISION     mesenteric mass resection    TUMOR REMOVAL  February 2010   benign    Social History   Socioeconomic History   Marital status: Married    Spouse name: Not on file   Number of children: Not on file   Years of education: Not on file   Highest education level: Not on file  Occupational History   Occupation: Retired  Tobacco Use   Smoking status: Never   Smokeless tobacco: Never  Substance and Sexual Activity   Alcohol use: Yes    Comment: socially   Drug use: No   Sexual activity: Not on  file  Other Topics Concern   Not on file  Social History Narrative   Lives with wife   Right Handed   Drinks 1-2 cups caffeine daily   Social Drivers of Corporate investment banker Strain: Not on file  Food Insecurity: Not on file  Transportation Needs: Not on file  Physical Activity: Not on file  Stress: Not on file  Social Connections: Not on file   Allergies  Allergen Reactions   Amlodipine Besylate     Other reaction(s): palpitations   Atorvastatin     Other reaction(s): leg weakness   Pravastatin Sodium     Other reaction(s): myalgias, stomach upset   Rosuvastatin     Other reaction(s): nausea, myalgias   Family History  Problem Relation Age of Onset   Osteoporosis Mother    Heart disease Father    Cancer Father        Leukemia   Bullous pemphigoid Sister      Current Outpatient Medications (Cardiovascular):    Evolocumab (REPATHA SURECLICK) 140 MG/ML SOAJ, Inject into the skin.   Current Outpatient Medications (Analgesics):    aspirin EC 81 MG tablet, Take 1 tablet (81 mg total) by mouth daily. Swallow whole.   Current Outpatient Medications (Other):  escitalopram (LEXAPRO) 10 MG tablet, Take 10 mg by mouth 2 (two) times daily.    fish oil-omega-3 fatty acids 1000 MG capsule, Take 2 g by mouth daily.   Glucosamine-Chondroitin (GLUCOSAMINE CHONDR COMPLEX PO), Take 2,400-3,000 mg by mouth 2 (two) times daily.   mirtazapine (REMERON) 30 MG tablet, Take 30 mg by mouth at bedtime.    omeprazole (PRILOSEC) 20 MG capsule, Take 20 mg by mouth 2 (two) times daily before a meal.   Reviewed prior external information including notes and imaging from  primary care provider As well as notes that were available from care everywhere and other healthcare systems.  Past medical history, social, surgical and family history all reviewed in electronic medical record.  No pertanent information unless stated regarding to the chief complaint.   Review of Systems:  No  headache, visual changes, nausea, vomiting, diarrhea, constipation, dizziness, abdominal pain, skin rash, fevers, chills, night sweats, weight loss, swollen lymph nodes, body aches, joint swelling, chest pain, shortness of breath, mood changes. POSITIVE muscle aches  Objective  Blood pressure 130/60, pulse 71, height 5\' 8"  (1.727 m), weight 224 lb (101.6 kg), SpO2 94%.   General: No apparent distress alert and oriented x3 mood and affect normal, dressed appropriately.  HEENT: Pupils equal, extraocular movements intact  Respiratory: Patient's speak in full sentences and does not appear short of breath  Cardiovascular: No lower extremity edema, non tender, no erythema  Bilateral knees do have crepitus noted.  Trace effusion noted on the left greater than the right.  Seems to be more in the patellofemoral joint.  Lacks last 10 degrees of flexion bilaterally.  After informed written and verbal consent, patient was seated on exam table. Right knee was prepped with alcohol swab and utilizing anterolateral approach, patient's right knee space was injected with 4:1  marcaine 0.5%: Kenalog 40mg /dL. Patient tolerated the procedure well without immediate complications.  After informed written and verbal consent, patient was seated on exam table. Left knee was prepped with alcohol swab and utilizing anterolateral approach, patient's left knee space was injected with 4:1  marcaine 0.5%: Kenalog 40mg /dL. Patient tolerated the procedure well without immediate complications.    Impression and Recommendations:    The above documentation has been reviewed and is accurate and complete Judi Saa, DO

## 2023-04-29 ENCOUNTER — Ambulatory Visit: Payer: PPO | Admitting: Family Medicine

## 2023-04-29 VITALS — BP 130/60 | HR 71 | Ht 68.0 in | Wt 224.0 lb

## 2023-04-29 DIAGNOSIS — M170 Bilateral primary osteoarthritis of knee: Secondary | ICD-10-CM | POA: Diagnosis not present

## 2023-04-29 NOTE — Patient Instructions (Addendum)
 Injected both knees today See me again in

## 2023-04-30 ENCOUNTER — Encounter: Payer: Self-pay | Admitting: Family Medicine

## 2023-04-30 NOTE — Assessment & Plan Note (Signed)
 Severe arthritic changes noted.  Patient wants to continue with the injections.  Allows him to do all daily activities as long as we can keep the swelling and pain down to a minimal.  Patient continues to workout on a fairly regular basis.  Discussed icing regimen and home exercises.  Follow-up again in 10 to 12 weeks

## 2023-07-05 NOTE — Progress Notes (Unsigned)
 [facility] [address] [place] [address] Phone: [phone] Subjective:   Terry Roy, am serving as a scribe for Dr. Ronnell Coins.  I'm seeing this patient by the request  of:  Benedetta Bradley, MD  CC: bilateral knee pain   [id number]  04/29/2023 Severe arthritic changes noted.  Patient wants to continue with the injections.  Allows him to do all daily activities as long as we can keep the swelling and pain down to a minimal.  Patient continues to workout on a fairly regular basis.  Discussed icing regimen and home exercises.  Follow-up again in 10 to 12 weeks     Updated 07/08/2023 Terry Roy is a 73 y.o. male coming in with complaint of B knee pain. Doing well. Here for regular knee injections.  Has had more discomfort recently.  Affecting daily activities.       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   Stroke (HCC) 02/2019   TIA   Wears glasses    Past Surgical History:  Procedure Laterality Date   HERNIA REPAIR     ventral hernia    KNEE ARTHROSCOPY Bilateral    MASS EXCISION     mesenteric mass resection    TUMOR REMOVAL  February 2010   benign    Social History   Socioeconomic History   Marital status: Married    Spouse name: Not on file   Number of children: Not on file   Years of education: Not on file   Highest education level: Not on file  Occupational History   Occupation: Retired  Tobacco Use   Smoking status: Never   Smokeless tobacco: Never  Substance and Sexual Activity   Alcohol use: Yes    Comment: socially   Drug use: No   Sexual activity: Not on file  Other Topics Concern   Not on file  Social History Narrative   Lives with wife   Right  Handed   Drinks 1-2 cups caffeine daily   Social Drivers of Corporate investment banker Strain: Not on file  Food Insecurity: Not on file  Transportation Needs: Not on file  Physical Activity: Not on file  Stress: Not on file  Social Connections: Not on file   Allergies  Allergen Reactions   Amlodipine Besylate     Other reaction(s): palpitations   Atorvastatin     Other reaction(s): leg weakness   Pravastatin Sodium     Other reaction(s): myalgias, stomach upset   Rosuvastatin     Other reaction(s): nausea, myalgias   Family History  Problem Relation Age of Onset   Osteoporosis Mother    Heart disease Father    Cancer Father        Leukemia   Bullous pemphigoid Sister      Current Outpatient Medications (Cardiovascular):    Evolocumab (REPATHA SURECLICK) 140 MG/ML SOAJ, Inject into the skin.   Current Outpatient Medications (Analgesics):    celecoxib (CELEBREX) 50 MG capsule, Take 2 capsules (100 mg total) by mouth 2 (two) times daily as needed.   aspirin  EC 81 MG tablet, Take 1 tablet (81 mg total) by mouth daily. Swallow whole.  Current Outpatient Medications (Other):    escitalopram (LEXAPRO) 10 MG tablet, Take 10 mg by mouth 2 (two) times daily.    fish oil-omega-3 fatty acids 1000 MG capsule, Take 2 g by mouth daily.   Glucosamine-Chondroitin (GLUCOSAMINE CHONDR COMPLEX PO), Take 2,400-3,000 mg by mouth 2 (two) times daily.   mirtazapine (REMERON) 30 MG tablet, Take 30 mg by mouth at bedtime.    omeprazole (PRILOSEC) 20 MG capsule, Take 20 mg by mouth 2 (two) times daily before a meal.   Reviewed prior external information including notes and imaging from  primary care provider As well as notes that were available from care everywhere and other healthcare systems.  Past medical history, social, surgical and family history all reviewed in electronic medical record.  No pertanent information unless stated regarding to the chief complaint.   Review of  Systems:  No headache, visual changes, nausea, vomiting, diarrhea, constipation, dizziness, abdominal pain, skin rash, fevers, chills, night sweats, weight loss, swollen lymph nodes, body aches, joint swelling, chest pain, shortness of breath, mood changes. POSITIVE muscle aches  Objective  Blood pressure 130/76, pulse 87, height 5' 8 (1.727 m), weight 230 lb (104.3 kg), SpO2 96%.   General: No apparent distress alert and oriented x3 mood and affect normal, dressed appropriately.  HEENT: Pupils equal, extraocular movements intact  Respiratory: Patient's speak in full sentences and does not appear short of breath  Cardiovascular: No lower extremity edema, non tender, no erythema  Knee exam does have some arthritic changes noted.  Trace effusion noted of the knees bilaterally.  Instability with valgus and varus force.  After informed written and verbal consent, patient was seated on exam table. Right knee was prepped with alcohol swab and utilizing anterolateral approach, patient's right knee space was injected with 4:1  marcaine 0.5%: Kenalog 40mg /dL. Patient tolerated the procedure well without immediate complications.  After informed written and verbal consent, patient was seated on exam table. Left knee was prepped with alcohol swab and utilizing anterolateral approach, patient's left knee space was injected with 4:1  marcaine 0.5%: Kenalog 40mg /dL. Patient tolerated the procedure well without immediate complications.    Impression and Recommendations:    The above documentation has been reviewed and is accurate and complete Terry Wander M Bowden Boody, DO

## 2023-07-08 ENCOUNTER — Encounter: Payer: Self-pay | Admitting: Family Medicine

## 2023-07-08 ENCOUNTER — Ambulatory Visit: Admitting: Family Medicine

## 2023-07-08 VITALS — BP 130/76 | HR 87 | Ht 68.0 in | Wt 230.0 lb

## 2023-07-08 DIAGNOSIS — M170 Bilateral primary osteoarthritis of knee: Secondary | ICD-10-CM

## 2023-07-08 MED ORDER — CELECOXIB 50 MG PO CAPS: 100.0000 mg | ORAL_CAPSULE | Freq: Two times a day (BID) | ORAL | 2 refills | Status: DC | PRN

## 2023-07-08 NOTE — Assessment & Plan Note (Signed)
 Worsening pain of a chronic problem.  Exacerbation noted.  Trying to lose weight but finding it difficult.  Needs to lose at least 25 pounds I would say.  Patient will continue to work on this.  Discussed icing regimen and home exercises.  Celebrex given.  Hopeful that this will be helpful for some of the acute exacerbations when needed.  Discussed icing regimen.  Follow-up again in 10 to 12 weeks

## 2023-07-08 NOTE — Patient Instructions (Signed)
Injections in knees today Good to see you! See you again in 10-12 weeks

## 2023-07-09 ENCOUNTER — Other Ambulatory Visit (HOSPITAL_COMMUNITY): Payer: Self-pay | Admitting: Internal Medicine

## 2023-07-09 DIAGNOSIS — R1900 Intra-abdominal and pelvic swelling, mass and lump, unspecified site: Secondary | ICD-10-CM

## 2023-07-09 DIAGNOSIS — R109 Unspecified abdominal pain: Secondary | ICD-10-CM

## 2023-07-11 ENCOUNTER — Encounter: Payer: Self-pay | Admitting: Family Medicine

## 2023-07-11 MED ORDER — CELECOXIB 100 MG PO CAPS: ORAL_CAPSULE | ORAL | 1 refills | Status: AC

## 2023-07-11 NOTE — Telephone Encounter (Signed)
 Will try the generic 100 mg.  The 50 mg sometimes I guess could be more difficult to get.  Sent in new prescription

## 2023-07-16 ENCOUNTER — Encounter (HOSPITAL_COMMUNITY): Payer: Self-pay

## 2023-07-16 ENCOUNTER — Ambulatory Visit (HOSPITAL_COMMUNITY)
Admission: RE | Admit: 2023-07-16 | Discharge: 2023-07-16 | Disposition: A | Discharge status: Home / Self Care | Source: Ambulatory Visit | Attending: Internal Medicine | Admitting: Internal Medicine

## 2023-07-16 DIAGNOSIS — R1900 Intra-abdominal and pelvic swelling, mass and lump, unspecified site: Secondary | ICD-10-CM | POA: Diagnosis present

## 2023-07-16 DIAGNOSIS — R109 Unspecified abdominal pain: Secondary | ICD-10-CM | POA: Insufficient documentation

## 2023-07-16 MED ORDER — IOHEXOL 9 MG/ML PO SOLN
1000.0000 mL | ORAL | Status: AC
  Administered 2023-07-16: 1000 mL via ORAL

## 2023-07-16 MED ORDER — IOHEXOL 9 MG/ML PO SOLN
ORAL | Status: AC
  Filled 2023-07-16: qty 1000

## 2023-07-16 MED ORDER — IOHEXOL 300 MG/ML  SOLN
100.0000 mL | Freq: Once | INTRAMUSCULAR | Status: AC | PRN
  Administered 2023-07-16: 100 mL via INTRAVENOUS

## 2023-07-16 MED ORDER — SODIUM CHLORIDE (PF) 0.9 % IJ SOLN
INTRAMUSCULAR | Status: AC
  Filled 2023-07-16: qty 50

## 2023-09-11 NOTE — Progress Notes (Signed)
 [facility] [address] [place] [address] Phone: [phone] Subjective:   Terry Roy am a scribe for Dr. Claudene.   I'm seeing this patient by the request  of:  Charlott Dorn LABOR, MD  CC: Bilateral knee pain  YEP:Dlagzrupcz  07/08/2023 Worsening pain of a chronic problem.  Exacerbation noted.  Trying to lose weight but finding it difficult.  Needs to lose at least 25 pounds I would say.  Patient will continue to work on this.  Discussed icing regimen and home exercises.  Celebrex  given.  Hopeful that this will be helpful for some of the acute exacerbations when needed.  Discussed icing regimen.  Follow-up again in 10 to 12 weeks     Updated 09/16/2023 Terry Roy is a 73 y.o. male coming in with complaint of B knee pain, known severe arthritic changes.  Patient states knees have been worse this time. Left knee has been clicking. Right knee has been buckling.        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   Stroke (HCC) 02/2019   TIA   Wears glasses    Past Surgical History:  Procedure Laterality Date   HERNIA REPAIR     ventral hernia    KNEE ARTHROSCOPY Bilateral    MASS EXCISION     mesenteric mass resection    TUMOR REMOVAL  February 2010   benign    Social History   Socioeconomic History   Marital status: Married    Spouse name: Not on file   Number of children: Not on file   Years of education: Not on file   Highest education level: Not on file  Occupational History   Occupation: Retired  Tobacco Use   Smoking status: Never   Smokeless tobacco: Never  Substance and Sexual Activity   Alcohol use: Yes    Comment: socially   Drug use: No   Sexual activity: Not on  file  Other Topics Concern   Not on file  Social History Narrative   Lives with wife   Right Handed   Drinks 1-2 cups caffeine daily   Social Drivers of Corporate investment banker Strain: Not on file  Food Insecurity: Not on file  Transportation Needs: Not on file  Physical Activity: Not on file  Stress: Not on file  Social Connections: Not on file   Allergies  Allergen Reactions   Amlodipine Besylate     Other reaction(s): palpitations   Atorvastatin     Other reaction(s): leg weakness   Pravastatin Sodium     Other reaction(s): myalgias, stomach upset   Rosuvastatin     Other reaction(s): nausea, myalgias   Family History  Problem Relation Age of Onset   Osteoporosis Mother    Heart disease Father    Cancer Father        Leukemia   Bullous pemphigoid Sister      Current Outpatient Medications (Cardiovascular):    Evolocumab (REPATHA SURECLICK) 140 MG/ML SOAJ, Inject into the skin.   Current Outpatient Medications (Analgesics):    aspirin  EC 81 MG tablet, Take 1 tablet (81 mg total) by mouth daily. Swallow whole.  celecoxib  (CELEBREX ) 100 MG capsule, One to 2 tablets by mouth daily as needed for pain.   Current Outpatient Medications (Other):    escitalopram (LEXAPRO) 10 MG tablet, Take 10 mg by mouth 2 (two) times daily.    fish oil-omega-3 fatty acids 1000 MG capsule, Take 2 g by mouth daily.   Glucosamine-Chondroitin (GLUCOSAMINE CHONDR COMPLEX PO), Take 2,400-3,000 mg by mouth 2 (two) times daily.   mirtazapine (REMERON) 30 MG tablet, Take 30 mg by mouth at bedtime.    omeprazole (PRILOSEC) 20 MG capsule, Take 20 mg by mouth 2 (two) times daily before a meal.   Reviewed prior external information including notes and imaging from  primary care provider As well as notes that were available from care everywhere and other healthcare systems.  Past medical history, social, surgical and family history all reviewed in electronic medical record.  No pertanent  information unless stated regarding to the chief complaint.   Review of Systems:  No headache, visual changes, nausea, vomiting, diarrhea, constipation, dizziness, abdominal pain, skin rash, fevers, chills, night sweats, weight loss, swollen lymph nodes, body aches, joint swelling, chest pain, shortness of breath, mood changes. POSITIVE muscle aches  Objective  There were no vitals taken for this visit.   General: No apparent distress alert and oriented x3 mood and affect normal, dressed appropriately.  HEENT: Pupils equal, extraocular movements intact  Respiratory: Patient's speak in full sentences and does not appear short of breath  Cardiovascular: No lower extremity edema, non tender, no erythema  Significant arthritic changes of the knees bilaterally.  This seems to be mostly in the medial compartment bilaterally.  Patient does have some crepitus of the patellofemoral joint noted as well.  Some instability with valgus and varus force but very good VMO strength noted.  After informed written and verbal consent, patient was seated on exam table. Right knee was prepped with alcohol swab and utilizing anterolateral approach, patient's right knee space was injected with 4:1  marcaine 0.5%: Kenalog 40mg /dL. Patient tolerated the procedure well without immediate complications.  After informed written and verbal consent, patient was seated on exam table. Left knee was prepped with alcohol swab and utilizing anterolateral approach, patient's left knee space was injected with 4:1  marcaine 0.5%: Kenalog 40mg /dL. Patient tolerated the procedure well without immediate complications.   Impression and Recommendations:    The above documentation has been reviewed and is accurate and complete Terry Koop M Earlin Sweeden, DO

## 2023-09-16 ENCOUNTER — Ambulatory Visit (INDEPENDENT_AMBULATORY_CARE_PROVIDER_SITE_OTHER): Admitting: Family Medicine

## 2023-09-16 ENCOUNTER — Encounter: Payer: Self-pay | Admitting: Family Medicine

## 2023-09-16 VITALS — BP 138/60 | HR 78 | Ht 68.0 in | Wt 228.0 lb

## 2023-09-16 DIAGNOSIS — M170 Bilateral primary osteoarthritis of knee: Secondary | ICD-10-CM

## 2023-09-16 NOTE — Assessment & Plan Note (Signed)
 Got with patient again about icing regimen and home exercises, which activities to do and which ones to avoid.  Increase activity slowly.  Discussed the possibility of PRP again.  Follow-up again in 10 to 12 weeks.

## 2023-09-16 NOTE — Patient Instructions (Addendum)
 Injections in knees today Good to see you! Use the stim machine See you again in 10 weeks

## 2023-11-26 ENCOUNTER — Ambulatory Visit: Admitting: Family Medicine

## 2023-12-11 NOTE — Progress Notes (Unsigned)
 [facility] [address] [place] [address] Phone: [phone] Subjective:   Terry Roy Berwyn Posey, am serving as a scribe for Dr. Arthea Claudene.  I'm seeing this patient by the request  of:  Charlott Dorn LABOR, MD  CC: Bilateral knee pain  YEP:Dlagzrupcz  09/16/2023 Got with patient again about icing regimen and home exercises, which activities to do and which ones to avoid.  Increase activity slowly.  Discussed the possibility of PRP again.  Follow-up again in 10 to 12 weeks.     Updated 12/12/2023 Terry Roy is a 73 y.o. male coming in with complaint of B knee pain, known to have severe arthritic changes of the knees.  Continue to stay very active.  Lifts weights on a regular basis.  Patient states that his R knee has been painful. L knee has been doing well. Pain throughout entire knee. Feels swelling in back of knee.        Past Medical History:  Diagnosis Date   Arthritis    Bilateral carpal tunnel syndrome 12/01/2019   Dilated aortic root    38mm by echo 02/2017   GERD (gastroesophageal reflux disease)    IBS (irritable bowel syndrome)    Mesenteric mass    benign fat necrosis and fibrosis   Stroke (HCC) 02/2019   TIA   Wears glasses    Past Surgical History:  Procedure Laterality Date   HERNIA REPAIR     ventral hernia    KNEE ARTHROSCOPY Bilateral    MASS EXCISION     mesenteric mass resection    TUMOR REMOVAL  February 2010   benign    Social History   Socioeconomic History   Marital status: Married    Spouse name: Not on file   Number of children: Not on file   Years of education: Not on file   Highest education level: Not on file  Occupational History   Occupation: Retired  Tobacco Use   Smoking status: Never   Smokeless tobacco: Never  Substance and Sexual Activity   Alcohol use: Yes    Comment: socially   Drug use: No   Sexual activity: Not on file  Other Topics Concern   Not on file  Social History  Narrative   Lives with wife   Right Handed   Drinks 1-2 cups caffeine daily   Social Drivers of Corporate Investment Banker Strain: Not on file  Food Insecurity: Not on file  Transportation Needs: Not on file  Physical Activity: Not on file  Stress: Not on file  Social Connections: Not on file   Allergies  Allergen Reactions   Amlodipine Besylate     Other reaction(s): palpitations   Atorvastatin     Other reaction(s): leg weakness   Pravastatin Sodium     Other reaction(s): myalgias, stomach upset   Rosuvastatin     Other reaction(s): nausea, myalgias   Family History  Problem Relation Age of Onset   Osteoporosis Mother    Heart disease Father    Cancer Father        Leukemia   Bullous pemphigoid Sister      Current Outpatient Medications (Cardiovascular):    Evolocumab (REPATHA SURECLICK) 140 MG/ML SOAJ, Inject into the skin.   Current Outpatient Medications (Analgesics):    aspirin  EC 81 MG tablet, Take 1 tablet (81 mg total) by mouth daily. Swallow whole.   celecoxib  (CELEBREX ) 100 MG capsule, One to 2 tablets by  mouth daily as needed for pain.   Current Outpatient Medications (Other):    escitalopram (LEXAPRO) 10 MG tablet, Take 10 mg by mouth 2 (two) times daily.    fish oil-omega-3 fatty acids 1000 MG capsule, Take 2 g by mouth daily.   Glucosamine-Chondroitin (GLUCOSAMINE CHONDR COMPLEX PO), Take 2,400-3,000 mg by mouth 2 (two) times daily.   mirtazapine (REMERON) 30 MG tablet, Take 30 mg by mouth at bedtime.    omeprazole (PRILOSEC) 20 MG capsule, Take 20 mg by mouth 2 (two) times daily before a meal.   Reviewed prior external information including notes and imaging from  primary care provider As well as notes that were available from care everywhere and other healthcare systems.  Past medical history, social, surgical and family history all reviewed in electronic medical record.  No pertanent information unless stated regarding to the chief complaint.    Review of Systems:  No headache, visual changes, nausea, vomiting, diarrhea, constipation, dizziness, abdominal pain, skin rash, fevers, chills, night sweats, weight loss, swollen lymph nodes, body aches, joint swelling, chest pain, shortness of breath, mood changes. POSITIVE muscle aches  Objective  There were no vitals taken for this visit.   General: No apparent distress alert and oriented x3 mood and affect normal, dressed appropriately.  HEENT: Pupils equal, extraocular movements intact  Respiratory: Patient's speak in full sentences and does not appear short of breath  Cardiovascular: No lower extremity edema, non tender, no erythema  Bilateral knees do have some arthritic changes noted. Trace effusion of the knees bilaterally.  Significant swelling of the Baker's cyst on the right knee  Procedure: Real-time Ultrasound Guided Injection of right knee Baker's cyst Device: GE Logiq Q7 Ultrasound guided injection is preferred based studies that show increased duration, increased effect, greater accuracy, decreased procedural pain, increased response rate, and decreased cost with ultrasound guided versus blind injection.  Verbal informed consent obtained.  Time-out conducted.  Noted no overlying erythema, induration, or other signs of local infection.  Skin prepped in a sterile fashion.  Local anesthesia: Topical Ethyl chloride.  With sterile technique and under real time ultrasound guidance: With a 22-gauge 2 inch needle patient was injected with 2 cc of 0.5% Marcaine and aspirated 40 cc of straw-colored fluid then injected 1 cc of Kenalog 40 mg/dL. This was from a posterior approach.  Completed without difficulty  Pain immediately resolved suggesting accurate placement of the medication.  Advised to call if fevers/chills, erythema, induration, drainage, or persistent bleeding.  Images permanently stored   Impression: Technically successful ultrasound guided injection.  After  informed written and verbal consent, patient was seated on exam table. Left knee was prepped with alcohol swab and utilizing anterolateral approach, patient's left knee space was injected with 4:1  marcaine 0.5%: Kenalog 40mg /dL. Patient tolerated the procedure well without immediate complications.   Impression and Recommendations:     The above documentation has been reviewed and is accurate and complete Terry Magnussen M Azim Gillingham, DO

## 2023-12-12 ENCOUNTER — Other Ambulatory Visit: Payer: Self-pay

## 2023-12-12 ENCOUNTER — Encounter: Payer: Self-pay | Admitting: Family Medicine

## 2023-12-12 ENCOUNTER — Ambulatory Visit: Admitting: Family Medicine

## 2023-12-12 VITALS — BP 118/62 | HR 78 | Ht 68.0 in

## 2023-12-12 DIAGNOSIS — M25561 Pain in right knee: Secondary | ICD-10-CM | POA: Diagnosis not present

## 2023-12-12 DIAGNOSIS — G8929 Other chronic pain: Secondary | ICD-10-CM

## 2023-12-12 DIAGNOSIS — M170 Bilateral primary osteoarthritis of knee: Secondary | ICD-10-CM | POA: Diagnosis not present

## 2023-12-12 NOTE — Patient Instructions (Signed)
See me in 8-10 weeks 

## 2023-12-12 NOTE — Assessment & Plan Note (Signed)
 Bilateral injections given today, tolerated the procedure well, discussed icing regimen and home exercises, increase activity slowly.  Will follow-up again in 6 to 12 weeks.

## 2023-12-31 NOTE — Progress Notes (Signed)
 [facility] - RETURN VISIT   PCP: Dorn Sauers, MD [facility]  REASON FOR VISIT: F/U for functional nausea  PRIOR WORKUP:  Over the years [name] has had a very, very extensive workup (see my initial note from 2010), including more recent evaluations by Dr. India Goods at [facility]. In 2010 he underwent surgical removal of a small mesenteric mass. In 2015 he revisited his general surgeon at (Dr. Gail at [facility]) who repeated a CT scan, which showed growth of small mesenteric mass to 3x4 cm. Octreotide  scan was negative, chromogranin-A was normal, and 24-hour urine 5-HIAA was normal. He later followed up with him and apparently was told no additional follow-up was needed. Repeat abdominal CT scan in early 2021 showed very slight growth (1-47mm) in the mass. Colonoscopy 2019 showed diverticulosis (which he thinks was mild) and had a small polyp removed. Colonoscopy in 2024 showed diverticulosis, no polyps. CT scan in 06/2022 showed a slight decrease in the size of the known mesenteric mass with increased calcifications.  PRIOR TREATMENTS:  Multiple treatments, some prescribed by me, including mirtazapine and escitalopram, ketorolac (no benefit) prednisone  (no benefit), promethazine, ondansetron, risperidone, and PPIs.   HISTORY OF PRESENT ILLNESS: Terry Roy is a very pleasant man from [facility] who for decades has suffered intermittent nausea and vomiting. I first met him in 2010 and diagnosed him with functional nausea and vomiting. We've since treated him with reassurance, education, PPI, mirtazapine, SSRIs, and he has done quite well.   Today Terry Roy continues to do well with Omeprazole before breakfast, Miratazpine and Lexapro. He has minimal nausea, is eating well, and exercising regularly. No vomiting. 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 But otherwise, things are going well and  we're quite pleased with how well he's doing.  He'll return in approximately 6 months, sooner if needed.  15 minutes in face-to-face time plus an additional 5 minutes in pre- and post-visit activities on the date of service.

## 2024-01-21 ENCOUNTER — Ambulatory Visit: Attending: Internal Medicine | Admitting: Audiology

## 2024-01-21 ENCOUNTER — Encounter: Payer: Self-pay | Admitting: Audiology

## 2024-01-21 DIAGNOSIS — H903 Sensorineural hearing loss, bilateral: Secondary | ICD-10-CM | POA: Diagnosis present

## 2024-01-21 HISTORY — PX: AUDIOLOGICAL EVALUATION: AUD1007

## 2024-01-21 NOTE — Procedures (Signed)
" °  Outpatient Audiology and [facility] [address] [place], [place]  [address] [phone]  AUDIOLOGICAL  EVALUATION  NAME: Terry Roy     DOB:   16-Feb-1950      MRN: [id number]                                                                                     DATE: 01/21/2024     REFERENT: Charlott Dorn LABOR, MD STATUS: Outpatient DIAGNOSIS: sensorineural hearing loss   History: Terry Roy was seen for an audiological evaluation due to concerns regarding his hearing sensitivity. Terry Roy reports difficulty hearing and communicating with his wife. Terry Roy reports difficulty hearing in the presence of background noise. Terry Roy denies otalgia, aural fullness, tinnitus, and dizziness.   Evaluation:  Otoscopy showed a clear view of the tympanic membranes, bilaterally Tympanometry results were consistent with normal middle ear function (Type A), bilaterally.  Audiometric testing was completed using Conventional Audiometry techniques with insert earphones and TDH headphones. Test results are consistent with normal hearing sensitivity at [phone] Hz sloping to a mild sensorineural hearing loss at 3000-8000 Hz. Speech Recognition Thresholds were obtained at 20 dB HL in the right ear and at 20  dB HL in the left ear. Word Recognition Testing was completed at 60 dB HL and Terry Roy scored 100% in both ears.    Results:  The test results were reviewed with [facility]. Test results are consistent with normal hearing sensitivity at [phone] Hz sloping to a mild sensorineural hearing loss at 3000-8000 Hz. Terry Roy may have hearing and communication difficulty in adverse listening environments. Terry Roy will benefit from the use of good communication strategies. Hearing aids are not recommended at this time.   Recommendations: 1.   Monitor hearing sensitivity. Return in 2 years for audiological monitoring    30 minutes spent testing and counseling on results.   If you have any  questions please feel free to contact me at [phone].  Terry Roy Audiologist, Au.D., CCC-A 01/21/2024  9:56 AM  Cc: Charlott Dorn LABOR, MD  "

## 2024-02-19 NOTE — Progress Notes (Unsigned)
 " [facility] [address] [place] [address] Phone: [phone] Subjective:   Terry Roy, am serving as a scribe for Dr. Arthea Claudene.  I'm seeing this patient by the request  of:  Charlott Dorn LABOR, MD  CC: Bilateral knee pain  YEP:Dlagzrupcz  12/12/2023 Bilateral injections given today, tolerated the procedure well, discussed icing regimen and home exercises, increase activity slowly.  Will follow-up again in 6 to 12 weeks.     Updated 02/20/2024 Terry Roy is a 74 y.o. male coming in with complaint of bilateral knee pain. His pain has worsened since last visit. Injections did not get him as much relief as they have previously.        Past Medical History:  Diagnosis Date   Arthritis    Bilateral carpal tunnel syndrome 12/01/2019   Dilated aortic root    38mm by echo 02/2017   GERD (gastroesophageal reflux disease)    IBS (irritable bowel syndrome)    Mesenteric mass    benign fat necrosis and fibrosis   Stroke (HCC) 02/2019   TIA   Wears glasses    Past Surgical History:  Procedure Laterality Date   AUDIOLOGICAL EVALUATION  01/21/2024   HERNIA REPAIR     ventral hernia    KNEE ARTHROSCOPY Bilateral    MASS EXCISION     mesenteric mass resection    TUMOR REMOVAL  February 2010   benign    Social History   Socioeconomic History   Marital status: Married    Spouse name: Not on file   Number of children: Not on file   Years of education: Not on file   Highest education level: Not on file  Occupational History   Occupation: Retired  Tobacco Use   Smoking status: Never   Smokeless tobacco: Never  Substance and Sexual Activity   Alcohol use: Yes    Comment: socially   Drug use: No   Sexual activity: Not on file  Other Topics Concern   Not on file  Social History Narrative   Lives with wife   Right Handed   Drinks 1-2 cups caffeine daily   Social Drivers of Health   Tobacco Use: Low Risk  (01/01/2024)   Received from [facility]   Patient History    Smoking Tobacco Use: Never    Smokeless Tobacco Use: Never    Passive Exposure: Never  Financial Resource Strain: Not on file  Food Insecurity: Not on file  Transportation Needs: Not on file  Physical Activity: Not on file  Stress: Not on file  Social Connections: Not on file  Depression (EYV7-0): Not on file  Alcohol Screen: Not on file  Housing: Not on file  Utilities: Not on file  Health Literacy: Not on file   Allergies[1] Family History  Problem Relation Age of Onset   Osteoporosis Mother    Heart disease Father    Cancer Father        Leukemia   Bullous pemphigoid Sister     Current Outpatient Medications (Cardiovascular):    Evolocumab (REPATHA SURECLICK) 140 MG/ML SOAJ, Inject into the skin.  Current Outpatient Medications (Analgesics):    aspirin  EC 81 MG tablet, Take 1 tablet (81 mg total) by mouth daily. Swallow whole.   celecoxib  (CELEBREX ) 100 MG capsule, One to 2 tablets by mouth daily as needed for pain.  Current Outpatient Medications (Other):    escitalopram (LEXAPRO) 10 MG tablet, Take 10 mg  by mouth 2 (two) times daily.    fish oil-omega-3 fatty acids 1000 MG capsule, Take 2 g by mouth daily.   Glucosamine-Chondroitin (GLUCOSAMINE CHONDR COMPLEX PO), Take 2,400-3,000 mg by mouth 2 (two) times daily.   mirtazapine (REMERON) 30 MG tablet, Take 30 mg by mouth at bedtime.    omeprazole (PRILOSEC) 20 MG capsule, Take 20 mg by mouth 2 (two) times daily before a meal.   Reviewed prior external information including notes and imaging from  primary care provider As well as notes that were available from care everywhere and other healthcare systems.  Past medical history, social, surgical and family history all reviewed in electronic medical record.  No pertanent information unless stated regarding to the chief complaint.   Review of Systems:  No headache, visual changes, nausea, vomiting,  diarrhea, constipation, dizziness, abdominal pain, skin rash, fevers, chills, night sweats, weight loss, swollen lymph nodes, body aches, joint swelling, chest pain, shortness of breath, mood changes. POSITIVE muscle aches  Objective  Blood pressure 118/80, pulse 70, height 5' 8 (1.727 m), weight 220 lb (99.8 kg), SpO2 98%.   General: No apparent distress alert and oriented x3 mood and affect normal, dressed appropriately.  HEENT: Pupils equal, extraocular movements intact  Respiratory: Patient's speak in full sentences and does not appear short of breath  Cardiovascular: No lower extremity edema, non tender, no erythema  Bilateral knees do have arthritic changes noted.  Crepitus noted with range of motion.  Mild instability with valgus and varus force.  After informed written and verbal consent, patient was seated on exam table. Right knee was prepped with alcohol swab and utilizing anterolateral approach, patient's right knee space was injected with 4:1  marcaine 0.5%: Kenalog 40mg /dL. Patient tolerated the procedure well without immediate complications.  After informed written and verbal consent, patient was seated on exam table. Left knee was prepped with alcohol swab and utilizing anterolateral approach, patient's left knee space was injected with 4:1  marcaine 0.5%: Kenalog 40mg /dL. Patient tolerated the procedure well without immediate complications.    Impression and Recommendations:    The above documentation has been reviewed and is accurate and complete Arthea CHRISTELLA Sharps, DO        [1]  Allergies Allergen Reactions   Amlodipine Besylate     Other reaction(s): palpitations   Atorvastatin     Other reaction(s): leg weakness   Pravastatin Sodium     Other reaction(s): myalgias, stomach upset   Rosuvastatin     Other reaction(s): nausea, myalgias   "

## 2024-02-20 ENCOUNTER — Encounter: Payer: Self-pay | Admitting: Family Medicine

## 2024-02-20 ENCOUNTER — Ambulatory Visit: Admitting: Family Medicine

## 2024-02-20 VITALS — BP 118/80 | HR 70 | Ht 68.0 in | Wt 220.0 lb

## 2024-02-20 DIAGNOSIS — M170 Bilateral primary osteoarthritis of knee: Secondary | ICD-10-CM | POA: Diagnosis not present

## 2024-02-20 NOTE — Assessment & Plan Note (Signed)
 Follow-up exacerbation again.  Patient has been dealing with this for nearly 4 years.  Patient has been to consider the possibility of surgical intervention.  BMI is under 35 which would make him a candidate.  We discussed icing regimen and home exercises otherwise.  Increase activity slowly.  Follow-up again in 6 to 12 weeks

## 2024-02-20 NOTE — Patient Instructions (Signed)
Injected both knees today See me again in 10 weeks 

## 2024-04-16 ENCOUNTER — Ambulatory Visit: Admitting: Family Medicine
